# Patient Record
Sex: Female | Born: 1995
Health system: Southern US, Community
[De-identification: ages and names within clinical notes are randomized; demographics above are authoritative.]

---
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
## 2016-01-15 ENCOUNTER — Ambulatory Visit (INDEPENDENT_AMBULATORY_CARE_PROVIDER_SITE_OTHER): Payer: BLUE CROSS/BLUE SHIELD | Admitting: Emergency Medicine

## 2016-01-15 ENCOUNTER — Encounter: Payer: Self-pay | Admitting: Emergency Medicine

## 2016-01-15 ENCOUNTER — Telehealth: Payer: Self-pay

## 2016-01-15 VITALS — BP 120/84 | HR 66 | Temp 98.3°F | Resp 16 | Ht 65.0 in | Wt 159.2 lb

## 2016-01-15 DIAGNOSIS — E049 Nontoxic goiter, unspecified: Secondary | ICD-10-CM | POA: Diagnosis not present

## 2016-01-15 DIAGNOSIS — E010 Iodine-deficiency related diffuse (endemic) goiter: Secondary | ICD-10-CM

## 2016-01-15 DIAGNOSIS — N6001 Solitary cyst of right breast: Secondary | ICD-10-CM | POA: Diagnosis not present

## 2016-01-15 DIAGNOSIS — N6002 Solitary cyst of left breast: Secondary | ICD-10-CM

## 2016-01-15 DIAGNOSIS — R197 Diarrhea, unspecified: Secondary | ICD-10-CM

## 2016-01-15 DIAGNOSIS — M25559 Pain in unspecified hip: Secondary | ICD-10-CM

## 2016-01-15 DIAGNOSIS — R102 Pelvic and perineal pain: Secondary | ICD-10-CM

## 2016-01-15 DIAGNOSIS — R1032 Left lower quadrant pain: Secondary | ICD-10-CM | POA: Diagnosis not present

## 2016-01-15 LAB — POCT CBC
Granulocyte percent: 44.9 %G (ref 37–80)
HCT, POC: 38.7 % (ref 37.7–47.9)
Hemoglobin: 13.4 g/dL (ref 12.2–16.2)
Lymph, poc: 2.4 (ref 0.6–3.4)
MCH: 31 pg (ref 27–31.2)
MCHC: 34.7 g/dL (ref 31.8–35.4)
MCV: 89.5 fL (ref 80–97)
MID (CBC): 0.2 (ref 0–0.9)
MPV: 7.4 fL (ref 0–99.8)
PLATELET COUNT, POC: 239 10*3/uL (ref 142–424)
POC Granulocyte: 2.2 (ref 2–6.9)
POC LYMPH PERCENT: 50.9 %L — AB (ref 10–50)
POC MID %: 4.2 %M (ref 0–12)
RBC: 4.33 M/uL (ref 4.04–5.48)
RDW, POC: 12.9 %
WBC: 4.8 10*3/uL (ref 4.6–10.2)

## 2016-01-15 LAB — POCT URINALYSIS DIP (MANUAL ENTRY)
BILIRUBIN UA: NEGATIVE
BILIRUBIN UA: NEGATIVE
GLUCOSE UA: NEGATIVE
Leukocytes, UA: NEGATIVE
Nitrite, UA: NEGATIVE
PH UA: 6
PROTEIN UA: NEGATIVE
SPEC GRAV UA: 1.015
Urobilinogen, UA: 0.2

## 2016-01-15 LAB — COMPLETE METABOLIC PANEL WITH GFR
ALK PHOS: 64 U/L (ref 47–176)
ALT: 14 U/L (ref 5–32)
AST: 17 U/L (ref 12–32)
Albumin: 4.6 g/dL (ref 3.6–5.1)
BUN: 8 mg/dL (ref 7–20)
CHLORIDE: 106 mmol/L (ref 98–110)
CO2: 23 mmol/L (ref 20–31)
Calcium: 9.3 mg/dL (ref 8.9–10.4)
Creat: 0.88 mg/dL (ref 0.50–1.00)
GFR, Est African American: >89 mL/min (ref >=60)
GLUCOSE: 91 mg/dL (ref 65–99)
POTASSIUM: 4 mmol/L (ref 3.8–5.1)
SODIUM: 138 mmol/L (ref 135–146)
Total Bilirubin: 0.4 mg/dL (ref 0.2–1.1)
Total Protein: 7.3 g/dL (ref 6.3–8.2)

## 2016-01-15 LAB — THYROID PANEL WITH TSH
FREE THYROXINE INDEX: 2.5 (ref 1.4–3.8)
T3 Uptake: 32 % (ref 22–35)
T4, Total: 7.9 ug/dL (ref 4.5–12.0)
TSH: 1.97 m[IU]/L (ref 0.50–4.30)

## 2016-01-15 LAB — POC MICROSCOPIC URINALYSIS (UMFC): Mucus: ABSENT

## 2016-01-15 LAB — POCT URINE PREGNANCY: PREG TEST UR: NEGATIVE

## 2016-01-15 NOTE — Patient Instructions (Addendum)
Take Aleve 1 twice a day. Use a heating pad to your abdomen. I have scheduled you for a lateral breast ultrasound. I have scheduled you for an ultrasound of your pelvis to evaluate her ovaries.    IF you received an x-ray today, you will receive an invoice from [facility]. Please contact [facility] at [phone] with questions or concerns regarding your invoice.   IF you received labwork today, you will receive an invoice from [facility]. Please contact [facility] at [phone] with questions or concerns regarding your invoice.   Our billing staff will not be able to assist you with questions regarding bills from these companies.  You will be contacted with the lab results as soon as they are available. The fastest way to get your results is to activate your My Chart account. Instructions are located on the last page of this paperwork. If you have not heard from us regarding the results in 2 weeks, please contact this office.

## 2016-01-15 NOTE — Progress Notes (Addendum)
Patient ID: Jasmine Hurley, female   DOB: 08-24-1995, 20 y.o.   MRN: [id number]    By signing my name below, I, Essence Howell, attest that this documentation has been prepared under the direction and in the presence of Collene GobbleSteven A Reham Slabaugh, MD Electronically Signed: Charline BillsEssence Howell, ED Scribe 01/15/2016 at 10:29 AM.  Chief Complaint:  Chief Complaint  Patient presents with  . Abdominal Pain    x this week  . Headache  . Diarrhea   HPI: Jasmine Hurley is a 20 y.o. female, with no pertinent medical hx, who [facility] complaining of intermittent, sharp abdominal pain for the past 3 days. Pt states that abdominal pain is exacerbated with palpation. She reports associated symptoms of diarrhea, nausea, 1 episode of emesis last night, HA and a left eye twitch for the past 3 days. Pt reports at least 3 episodes of diarrhea that she describes as watery and mushy daily for the past 3 days. She denies new foods. Pt has also noticed that HAs have increased since she switched birth control from depo to Nexplanon implant. Pt reports irregular periods that last for 3-4 weeks at a time. She has never had a pap smear; states she has never been sexually active. She reports finding cysts in both breasts while in college at [employer]. Pt states that she was supposed to be referred to have the cysts drained but was never referred. No family h/o breast CA. She denies fever, dysuria and vaginal discharge.   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  History reviewed. No pertinent past medical history. History reviewed. No pertinent past surgical history. Social History   Social History  . Marital Status: Single    Spouse Name: N/A  . Number of Children: N/A  . Years of Education: N/A   Social History Main Topics  . Smoking status: Never Smoker   . Smokeless tobacco: None  . Alcohol Use: No  . Drug Use: No  . Sexual Activity: Not Asked   Other Topics Concern  . None   Social History Narrative    Family History  Problem Relation Age of Onset  . Hypertension Mother   . Hypertension Father    No Known Allergies Prior to Admission medications   Medication Sig Start Date End Date Taking? Authorizing Provider  etonogestrel (NEXPLANON) 68 MG IMPL implant 1 each by Subdermal route once.   Yes Historical Provider, MD   ROS: The patient denies fevers, chills, night sweats, unintentional weight loss, chest pain, palpitations, wheezing, dyspnea on exertion, dysuria, hematuria, melena, numbness, weakness, or tingling.   All other systems have been reviewed and were otherwise negative with the exception of those mentioned in the HPI and as above.    PHYSICAL EXAM: Filed Vitals:   01/15/16 0906  BP: 120/84  Pulse: 66  Temp: 98.3 F (36.8 C)  Resp: 16   Body mass index is 26.49 kg/(m^2).  General: Alert, no acute distress HEENT:  Normocephalic, atraumatic, oropharynx patent. Thyroid is diffusely large, L greater than R Eye: EOMI, PEERLDC Cardiovascular: Regular rate and rhythm, no rubs murmurs or gallops. No Carotid bruits, radial pulse intact. No pedal edema.  Respiratory: Clear to auscultation bilaterally. No wheezes, rales, or rhonchi. No cyanosis, no use of accessory musculature Abdominal: Tender in the deep LLQ  Musculoskeletal: Gait intact. No edema, tenderness GU: Minimal fullness beneath the areolar of the L breast but no definite mass Skin: No rashes. Neurologic: Facial musculature symmetric. Psychiatric: Patient  acts appropriately throughout our interaction. Lymphatic: No cervical or submandibular lymphadenopathy  LABS: Results for orders placed or performed in visit on 01/15/16  POCT CBC  Result Value Ref Range   WBC 4.8 4.6 - 10.2 K/uL   Lymph, poc 2.4 0.6 - 3.4   POC LYMPH PERCENT 50.9 (A) 10 - 50 %L   MID (cbc) 0.2 0 - 0.9   POC MID % 4.2 0 - 12 %M   POC Granulocyte 2.2 2 - 6.9   Granulocyte percent 44.9 37 - 80 %G   RBC 4.33 4.04 - 5.48 M/uL   Hemoglobin  13.4 12.2 - 16.2 g/dL   HCT, POC 16.1 09.6 - 47.9 %   MCV 89.5 80 - 97 fL   MCH, POC 31.0 27 - 31.2 pg   MCHC 34.7 31.8 - 35.4 g/dL   RDW, POC 04.5 %   Platelet Count, POC 239 142 - 424 K/uL   MPV 7.4 0 - 99.8 fL  POCT Microscopic Urinalysis ([facility])  Result Value Ref Range   WBC,UR,HPF,POC None None WBC/hpf   RBC,UR,HPF,POC Few (A) None RBC/hpf   Bacteria None None, Too numerous to count   Mucus Absent Absent   Epithelial Cells, UR Per Microscopy Few (A) None, Too numerous to count cells/hpf  POCT urinalysis dipstick  Result Value Ref Range   Color, UA yellow yellow   Clarity, UA clear clear   Glucose, UA negative negative   Bilirubin, UA negative negative   Ketones, POC UA negative negative   Spec Grav, UA 1.015    Blood, UA moderate (A) negative   pH, UA 6.0    Protein Ur, POC negative negative   Urobilinogen, UA 0.2    Nitrite, UA Negative Negative   Leukocytes, UA Negative Negative  POCT urine pregnancy  Result Value Ref Range   Preg Test, Ur Negative Negative   EKG/XRAY:   Primary read interpreted by Dr. Cleta Alberts at [facility].  ASSESSMENT/PLAN:   I suspect she may have had an ovarian cyst rupture. Advised her to try Aleve 1 twice a day for pain. She can also use a heating pad to her abdomen. I will schedule an ultrasound of her breasts as well as an ultrasound of her pelvis for further evaluation. She also have thyromegaly on exam will await the results of her blood tests for this.I personally performed the services described in this documentation, which was scribed in my presence. The recorded information has been reviewed and is accurate.She did have a few red cells on her urinalysis but history did not fit with an kidney stone though she did have some deep left lower abdominal discomfort. I initially was going to do a pelvic exam but patient was very apprehensive and stated repeatedly she had never in her life been sexually active. I chose not to do a pelvic for this  reason.  Gross sideeffects, risk and benefits, and alternatives of medications d/w patient. Patient is aware that all medications have potential sideeffects and we are unable to predict every sideeffect or drug-drug interaction that may occur.  Lesle Chris MD 01/15/2016 9:22 AM

## 2016-01-15 NOTE — Telephone Encounter (Signed)
This has been done - thank you!

## 2016-01-15 NOTE — Telephone Encounter (Signed)
In order the schedule the pelvic ultrasound, an order for a transvaginal ultrasound is also needed.  Please add the order. Thank you.

## 2016-01-16 ENCOUNTER — Other Ambulatory Visit: Payer: Self-pay | Admitting: Emergency Medicine

## 2016-01-16 DIAGNOSIS — E049 Nontoxic goiter, unspecified: Secondary | ICD-10-CM

## 2016-01-26 ENCOUNTER — Other Ambulatory Visit: Payer: Self-pay

## 2016-01-26 DIAGNOSIS — N6002 Solitary cyst of left breast: Secondary | ICD-10-CM

## 2016-01-26 DIAGNOSIS — N6001 Solitary cyst of right breast: Secondary | ICD-10-CM

## 2016-01-26 NOTE — Telephone Encounter (Signed)
[facility] reqs change of orders to Limited Breast US, which is the test that would be needed for a pt of this age. Done per Dr Ellis Parents VO to change.

## 2016-01-29 ENCOUNTER — Ambulatory Visit
Admission: RE | Admit: 2016-01-29 | Discharge: 2016-01-29 | Disposition: A | Discharge status: Home / Self Care | Payer: Medicaid Other | Source: Ambulatory Visit | Attending: Emergency Medicine | Admitting: Emergency Medicine

## 2016-01-29 ENCOUNTER — Ambulatory Visit
Admission: RE | Admit: 2016-01-29 | Discharge: 2016-01-29 | Disposition: A | Discharge status: Home / Self Care | Payer: BLUE CROSS/BLUE SHIELD | Source: Ambulatory Visit | Attending: Emergency Medicine | Admitting: Emergency Medicine

## 2016-01-29 DIAGNOSIS — N6002 Solitary cyst of left breast: Secondary | ICD-10-CM

## 2016-01-29 DIAGNOSIS — R1032 Left lower quadrant pain: Secondary | ICD-10-CM

## 2016-01-29 DIAGNOSIS — R102 Pelvic and perineal pain: Secondary | ICD-10-CM

## 2016-01-29 DIAGNOSIS — E049 Nontoxic goiter, unspecified: Secondary | ICD-10-CM

## 2016-01-29 DIAGNOSIS — N6001 Solitary cyst of right breast: Secondary | ICD-10-CM

## 2016-02-02 ENCOUNTER — Encounter: Payer: Self-pay | Admitting: Radiology

## 2016-02-03 ENCOUNTER — Other Ambulatory Visit: Payer: Self-pay

## 2016-02-03 DIAGNOSIS — N6002 Solitary cyst of left breast: Principal | ICD-10-CM

## 2016-02-03 DIAGNOSIS — N6001 Solitary cyst of right breast: Secondary | ICD-10-CM

## 2016-02-13 ENCOUNTER — Ambulatory Visit: Payer: BLUE CROSS/BLUE SHIELD

## 2016-02-16 ENCOUNTER — Ambulatory Visit: Payer: BLUE CROSS/BLUE SHIELD

## 2016-02-20 ENCOUNTER — Ambulatory Visit (INDEPENDENT_AMBULATORY_CARE_PROVIDER_SITE_OTHER): Payer: BLUE CROSS/BLUE SHIELD | Admitting: Urgent Care

## 2016-02-20 ENCOUNTER — Other Ambulatory Visit: Payer: BLUE CROSS/BLUE SHIELD

## 2016-02-20 ENCOUNTER — Encounter: Payer: Self-pay | Admitting: Family Medicine

## 2016-02-20 VITALS — BP 115/75 | HR 100 | Temp 98.3°F | Resp 16 | Ht 65.5 in | Wt 157.4 lb

## 2016-02-20 DIAGNOSIS — R6889 Other general symptoms and signs: Secondary | ICD-10-CM

## 2016-02-20 DIAGNOSIS — E041 Nontoxic single thyroid nodule: Secondary | ICD-10-CM

## 2016-02-20 DIAGNOSIS — R5383 Other fatigue: Secondary | ICD-10-CM

## 2016-02-20 NOTE — Patient Instructions (Addendum)
Fatigue Fatigue is feeling tired all of the time, a lack of energy, or a lack of motivation. Occasional or mild fatigue is often a normal response to activity or life in general. However, long-lasting (chronic) or extreme fatigue may indicate an underlying medical condition. HOME CARE INSTRUCTIONS  Watch your fatigue for any changes. The following actions may help to lessen any discomfort you are feeling:  Talk to your health care provider about how much sleep you need each night. Try to get the required amount every night.  Take medicines only as directed by your health care provider.  Eat a healthy and nutritious diet. Ask your health care provider if you need help changing your diet.  Drink enough fluid to keep your urine clear or pale yellow.  Practice ways of relaxing, such as yoga, meditation, massage therapy, or acupuncture.  Exercise regularly.   Change situations that cause you stress. Try to keep your work and personal routine reasonable.  Do not abuse illegal drugs.  Limit alcohol intake to no more than 1 drink per day for nonpregnant women and 2 drinks per day for men. One drink equals 12 ounces of beer, 5 ounces of wine, or 1 ounces of hard liquor.  Take a multivitamin, if directed by your health care provider. SEEK MEDICAL CARE IF:   Your fatigue does not get better.  You have a fever.   You have unintentional weight loss or gain.  You have headaches.   You have difficulty:   Falling asleep.  Sleeping throughout the night.  You feel angry, guilty, anxious, or sad.   You are unable to have a bowel movement (constipation).   You skin is dry.   Your legs or another part of your body is swollen.  SEEK IMMEDIATE MEDICAL CARE IF:   You feel confused.   Your vision is blurry.  You feel faint or pass out.   You have a severe headache.   You have severe abdominal, pelvic, or back pain.   You have chest pain, shortness of breath, or an  irregular or fast heartbeat.   You are unable to urinate or you urinate less than normal.   You develop abnormal bleeding, such as bleeding from the rectum, vagina, nose, lungs, or nipples.  You vomit blood.   You have thoughts about harming yourself or committing suicide.   You are worried that you might harm someone else.    This information is not intended to replace advice given to you by your health care provider. Make sure you discuss any questions you have with your health care provider.   Document Released: 04/18/2007 Document Revised: 07/12/2014 Document Reviewed: 10/23/2013 Elsevier Interactive Patient Education 2016 [employer]     IF you received an x-ray today, you will receive an invoice from [facility]. Please contact [facility] at [phone] with questions or concerns regarding your invoice.   IF you received labwork today, you will receive an invoice from [facility]. Please contact [facility] at [phone] with questions or concerns regarding your invoice.   Our billing staff will not be able to assist you with questions regarding bills from these companies.  You will be contacted with the lab results as soon as they are available. The fastest way to get your results is to activate your My Chart account. Instructions are located on the last page of this paperwork. If you have not heard from us regarding the results in 2 weeks, please contact this office.

## 2016-02-20 NOTE — Progress Notes (Signed)
    MRN: [id number] DOB: Jul 21, 1995  Subjective:   Jasmine Hurley is a 20 y.o. female presenting for follow up on abdominal pain and thyroid.   Patient was seen in 01/2016 and had a pelvic US followed by neck US. She reports that all her symptoms have improved including her belly and pelvic discomfort, neck size. She states overall she is doing well except still has fatigue. She would like a review of her labs and plan for follow up. Denies smoking cigarettes or alcohol use.  Jasmine Hurley has a current medication list which includes the following prescription(s): etonogestrel. Also has no allergies on file.  Jasmine Hurley  has no past medical history on file. Also  has no past surgical history on file.   Her family history includes Hypertension in her father and mother.   Objective:   Vitals: BP 115/75 (BP Location: Left Arm, Patient Position: Sitting, Cuff Size: Normal)   Pulse 100   Temp 98.3 F (36.8 C) (Oral)   Resp 16   Ht 5' 5.5" (1.664 m)   Wt 157 lb 6.4 oz (71.4 kg)   SpO2 98%   BMI 25.79 kg/m   Physical Exam  Constitutional: She is oriented to person, place, and time. She appears well-developed and well-nourished.  HENT:  Mouth/Throat: Oropharynx is clear and moist.  Eyes: Pupils are equal, round, and reactive to light. No scleral icterus.  Neck: Normal range of motion. Neck supple. No thyromegaly present.  Cardiovascular: Normal rate, regular rhythm and intact distal pulses.  Exam reveals no gallop and no friction rub.   No murmur heard. Pulmonary/Chest: No respiratory distress. She has no wheezes. She has no rales.  Abdominal: Soft. Bowel sounds are normal. She exhibits no distension and no mass. There is no tenderness.  Neurological: She is alert and oriented to person, place, and time.  Skin: Skin is warm and dry. Capillary refill takes less than 2 seconds.   Assessment and Plan :   1. Other fatigue 2. Cold intolerance 3. Thyroid nodule - Reviewed all results with  patient. Advised better hydration as patient is very active physically and hardly drinks any water. I counseled on recommendations regarding her thyroid as made by the radiologist. Patient verbalized understanding.   Wallis BambergMario Sama Arauz, PA-C [facility] [phone] 02/20/2016 5:52 PM

## 2016-02-21 LAB — CBC
HEMATOCRIT: 37.6 % (ref 35.0–45.0)
HEMOGLOBIN: 12.6 g/dL (ref 11.7–15.5)
MCH: 30.2 pg (ref 27.0–33.0)
MCHC: 33.5 g/dL (ref 32.0–36.0)
MCV: 90.2 fL (ref 80.0–100.0)
MPV: 9.2 fL (ref 7.5–12.5)
PLATELETS: 323 10*3/uL (ref 140–400)
RBC: 4.17 MIL/uL (ref 3.80–5.10)
RDW: 12.3 % (ref 11.0–15.0)
WBC: 4.7 10*3/uL (ref 3.8–10.8)

## 2016-02-23 ENCOUNTER — Encounter: Payer: Self-pay | Admitting: Urgent Care

## 2016-02-23 LAB — HEMOGLOBIN A1C
Hgb A1c MFr Bld: 5.5 % (ref <5.7)
Mean Plasma Glucose: 111 mg/dL

## 2016-02-27 ENCOUNTER — Other Ambulatory Visit: Payer: BLUE CROSS/BLUE SHIELD

## 2016-03-05 ENCOUNTER — Other Ambulatory Visit: Payer: BLUE CROSS/BLUE SHIELD

## 2016-05-28 ENCOUNTER — Ambulatory Visit (INDEPENDENT_AMBULATORY_CARE_PROVIDER_SITE_OTHER): Payer: BLUE CROSS/BLUE SHIELD | Admitting: Family Medicine

## 2016-05-28 ENCOUNTER — Ambulatory Visit: Payer: BLUE CROSS/BLUE SHIELD

## 2016-05-28 VITALS — BP 118/76 | HR 74 | Temp 98.6°F | Resp 16 | Ht 65.0 in | Wt 163.0 lb

## 2016-05-28 DIAGNOSIS — N926 Irregular menstruation, unspecified: Secondary | ICD-10-CM

## 2016-05-28 DIAGNOSIS — R059 Cough, unspecified: Secondary | ICD-10-CM

## 2016-05-28 DIAGNOSIS — R350 Frequency of micturition: Secondary | ICD-10-CM | POA: Diagnosis not present

## 2016-05-28 DIAGNOSIS — R1084 Generalized abdominal pain: Secondary | ICD-10-CM | POA: Diagnosis not present

## 2016-05-28 DIAGNOSIS — R198 Other specified symptoms and signs involving the digestive system and abdomen: Secondary | ICD-10-CM

## 2016-05-28 DIAGNOSIS — R05 Cough: Secondary | ICD-10-CM

## 2016-05-28 LAB — POCT URINALYSIS DIP (MANUAL ENTRY)
Bilirubin, UA: NEGATIVE
GLUCOSE UA: NEGATIVE
Ketones, POC UA: NEGATIVE
Leukocytes, UA: NEGATIVE
NITRITE UA: NEGATIVE
PH UA: 6.5
Protein Ur, POC: NEGATIVE
SPEC GRAV UA: 1.015
UROBILINOGEN UA: 0.2

## 2016-05-28 LAB — POC MICROSCOPIC URINALYSIS (UMFC): Mucus: ABSENT

## 2016-05-28 LAB — POCT URINE PREGNANCY: PREG TEST UR: NEGATIVE

## 2016-05-28 MED ORDER — HYDROCODONE-HOMATROPINE 5-1.5 MG/5ML PO SYRP: ORAL_SOLUTION | ORAL | 0 refills | Status: DC

## 2016-05-28 NOTE — Patient Instructions (Addendum)
For your abdominal symptoms, as we discussed constipations can sometimes present this way. Make sure you're drinking plenty of water throughout the day, fiber in the diet, Colace over-the-counter for stool softener, and if you have not had a bowel movement in 2 days, you can try MiraLAX over-the-counter, one dose. I will also refer you to gastroenterology, but if you have fevers, abdominal pain that lasts for more than 1-2 days, or any worsening of your symptoms, return here or other medical care provider right away.  Your cough, your lungs were clear today, this may be an early upper respiratory infection. See information on cough below. You can try Mucinex or Mucinex DM over-the-counter. If needed for cough at night to allow you to sleep, I did write for some hydrocodone cough syrup. Use the smallest amount necessary and only if needed to help with sleep.  If you have fevers, shortness of breath or other worsening, return for recheck.   Please return to see Benny LennertSarah Weber to discuss the Nexplanon further as well as option/cost of removal and other options.    Cough, Adult Coughing is a reflex that clears your throat and your airways. Coughing helps to heal and protect your lungs. It is normal to cough occasionally, but a cough that happens with other symptoms or lasts a long time may be a sign of a condition that needs treatment. A cough may last only 2-3 weeks (acute), or it may last longer than 8 weeks (chronic). What are the causes? Coughing is commonly caused by:  Breathing in substances that irritate your lungs.  A viral or bacterial respiratory infection.  Allergies.  Asthma.  Postnasal drip.  Smoking.  Acid backing up from the stomach into the esophagus (gastroesophageal reflux).  Certain medicines.  Chronic lung problems, including COPD (or rarely, lung cancer).  Other medical conditions such as heart failure. Follow these instructions at home: Pay attention to any changes in  your symptoms. Take these actions to help with your discomfort:  Take medicines only as told by your health care provider.  If you were prescribed an antibiotic medicine, take it as told by your health care provider. Do not stop taking the antibiotic even if you start to feel better.  Talk with your health care provider before you take a cough suppressant medicine.  Drink enough fluid to keep your urine clear or pale yellow.  If the air is dry, use a cold steam vaporizer or humidifier in your bedroom or your home to help loosen secretions.  Avoid anything that causes you to cough at work or at home.  If your cough is worse at night, try sleeping in a semi-upright position.  Avoid cigarette smoke. If you smoke, quit smoking. If you need help quitting, ask your health care provider.  Avoid caffeine.  Avoid alcohol.  Rest as needed. Contact a health care provider if:  You have new symptoms.  You cough up pus.  Your cough does not get better after 2-3 weeks, or your cough gets worse.  You cannot control your cough with suppressant medicines and you are losing sleep.  You develop pain that is getting worse or pain that is not controlled with pain medicines.  You have a fever.  You have unexplained weight loss.  You have night sweats. Get help right away if:  You cough up blood.  You have difficulty breathing.  Your heartbeat is very fast. This information is not intended to replace advice given to you by your  health care provider. Make sure you discuss any questions you have with your health care provider. Document Released: 12/18/2010 Document Revised: 11/27/2015 Document Reviewed: 08/28/2014 Elsevier Interactive Patient Education  2017 Elsevier Inc.  Diarrhea, Adult Diarrhea is frequent loose and watery bowel movements. Diarrhea can make you feel weak and cause you to become dehydrated. Dehydration can make you tired and thirsty, cause you to have a dry mouth, and  decrease how often you urinate. Diarrhea typically lasts 2-3 days. However, it can last longer if it is a sign of something more serious. It is important to treat your diarrhea as told by your health care provider. Follow these instructions at home: Eating and drinking Follow these recommendations as told by your health care provider:  Take an oral rehydration solution (ORS). This is a drink that is sold at pharmacies and retail stores.  Drink clear fluids, such as water, ice chips, diluted fruit juice, and low-calorie sports drinks.  Eat bland, easy-to-digest foods in small amounts as you are able. These foods include bananas, applesauce, rice, lean meats, toast, and crackers.  Avoid drinking fluids that contain a lot of sugar or caffeine, such as energy drinks, sports drinks, and soda.  Avoid alcohol.  Avoid spicy or fatty foods. General instructions  Drink enough fluid to keep your urine clear or pale yellow.  Wash your hands often. If soap and water are not available, use hand sanitizer.  Make sure that all people in your household wash their hands well and often.  Take over-the-counter and prescription medicines only as told by your health care provider.  Rest at home while you recover.  Watch your condition for any changes.  Take a warm bath to relieve any burning or pain from frequent diarrhea episodes.  Keep all follow-up visits as told by your health care provider. This is important. Contact a health care provider if:  You have a fever.  Your diarrhea gets worse.  You have new symptoms.  You cannot keep fluids down.  You feel light-headed or dizzy.  You have a headache  You have muscle cramps. Get help right away if:  You have chest pain.  You feel extremely weak or you faint.  You have bloody or black stools or stools that look like tar.  You have severe pain, cramping, or bloating in your abdomen.  You have trouble breathing or you are breathing  very quickly.  Your heart is beating very quickly.  Your skin feels cold and clammy.  You feel confused.  You have signs of dehydration, such as:  Dark urine, very little urine, or no urine.  Cracked lips.  Dry mouth.  Sunken eyes.  Sleepiness.  Weakness. This information is not intended to replace advice given to you by your health care provider. Make sure you discuss any questions you have with your health care provider. Document Released: 06/11/2002 Document Revised: 10/30/2015 Document Reviewed: 02/25/2015 Elsevier Interactive Patient Education  2017 [employer]. About Constipation  Constipation Overview Constipation is the most common gastrointestinal complaint - about 4 million Americans experience constipation and make 2.5 million physician visits a year to get help for the problem.  Constipation can occur when the colon absorbs too much water, the colon's muscle contraction is slow or sluggish, and/or there is delayed transit time through the colon.  The result is stool that is hard and dry.  Indicators of constipation include straining during bowel movements greater than 25% of the time, having fewer than three bowel movements  per week, and/or the feeling of incomplete evacuation.  There are established guidelines ([place] II ) for defining constipation. A person needs to have two or more of the following symptoms for at least 12 weeks (not necessarily consecutive) in the preceding 12 months: . Straining in  greater than 25% of bowel movements . Lumpy or hard stools in greater than 25% of bowel movements . Sensation of incomplete emptying in greater than 25% of bowel movements . Sensation of anorectal obstruction/blockade in greater than 25% of bowel movements . Manual maneuvers to help empty greater than 25% of bowel movements (e.g., digital evacuation, support of the pelvic floor)  . Less than  3 bowel movements/week . Loose stools are not present, and criteria for  irritable bowel syndrome are insufficient  Common Causes of Constipation . Lack of fiber in your diet . Lack of physical activity . Medications, including iron and calcium supplements  . Dairy intake . Dehydration . Abuse of laxatives  Travel  Irritable Bowel Syndrome  Pregnancy  Luteal phase of menstruation (after ovulation and before menses)  Colorectal problems  Intestinal Dysfunction  Treating Constipation  There are several ways of treating constipation, including changes to diet and exercise, use of laxatives, adjustments to the pelvic floor, and scheduled toileting.  These treatments include: . increasing fiber and fluids in the diet  . increasing physical activity . learning muscle coordination   learning proper toileting techniques and toileting modifications   designing and sticking  to a toileting schedule     2007, [employer] Doc.22  IF you received an x-ray today, you will receive an invoice from [facility]. Please contact [facility] at [phone] with questions or concerns regarding your invoice.   IF you received labwork today, you will receive an invoice from [facility]. Please contact [facility] at [phone] with questions or concerns regarding your invoice.   Our billing staff will not be able to assist you with questions regarding bills from these companies.  You will be contacted with the lab results as soon as they are available. The fastest way to get your results is to activate your My Chart account. Instructions are located on the last page of this paperwork. If you have not heard from us regarding the results in 2 weeks, please contact this office.

## 2016-05-28 NOTE — Progress Notes (Signed)
Subjective:    Patient ID: Jasmine Hurley, female    DOB: [birth year], 20 y.o.   MRN: [id number]  HPI Jasmine Hurley is a 20 y.o. female  Over past week - has trouble initiating bowel movement,but small pellets. Then has some diarrhea at other times - liquid stool at other times. No attempted treatments. No similar sx's previously. abd pain at times - sharp pains lower abdomen, sometimes on sides. More with soda. abd pain at times off and on since the summer. Occasional bladder full feeling, urinary frequency - but not new.   LMP - unknown. Irregular menses. Last bleeding few days ago. Spotting.  Received nexplanon December 2017, irregular bleeding/spotting since March. Does not like having Nexplanon. Would like to have removed. Would like to go onto Depo-Provera shot instead.  Cough few times today, no fever or dyspnea. Minimal cough.    Patient Active Problem List   Diagnosis Date Noted  . Thyromegaly 01/15/2016   No past medical history on file. No past surgical history on file. No Known Allergies Prior to Admission medications   Medication Sig Start Date End Date Taking? Authorizing Provider  etonogestrel (NEXPLANON) 68 MG IMPL implant 1 each by Subdermal route once.   Yes Historical Provider, MD   Social History   Social History  . Marital status: Single    Spouse name: N/A  . Number of children: N/A  . Years of education: N/A   Occupational History  . Not on file.   Social History Main Topics  . Smoking status: Never Smoker  . Smokeless tobacco: Not on file  . Alcohol use No  . Drug use: No  . Sexual activity: Not on file   Other Topics Concern  . Not on file   Social History Narrative  . No narrative on file      Review of Systems     Objective:   Physical Exam  Constitutional: She appears well-developed and well-nourished. No distress.  Pulmonary/Chest: Effort normal.  Abdominal: Soft. Normal appearance and bowel sounds are normal. She exhibits no  distension. There is no hepatosplenomegaly. There is no tenderness. There is no rebound, no guarding and no CVA tenderness.  Skin: Skin is warm and dry.  Vitals reviewed.  Vitals:   05/28/16 1130  BP: 118/76  Pulse: 74  Resp: 16  Temp: 98.6 F (37 C)  TempSrc: Oral  Weight: 163 lb (73.9 kg)  Height: 5\' 5"  (1.651 m)   Results for orders placed or performed in visit on 05/28/16  POCT urine pregnancy  Result Value Ref Range   Preg Test, Ur Negative Negative  POCT urinalysis dipstick  Result Value Ref Range   Color, UA yellow yellow   Clarity, UA clear clear   Glucose, UA negative negative   Bilirubin, UA negative negative   Ketones, POC UA negative negative   Spec Grav, UA 1.015    Blood, UA small (A) negative   pH, UA 6.5    Protein Ur, POC negative negative   Urobilinogen, UA 0.2    Nitrite, UA Negative Negative   Leukocytes, UA Negative Negative  POCT Microscopic Urinalysis ([facility])  Result Value Ref Range   WBC,UR,HPF,POC None None WBC/hpf   RBC,UR,HPF,POC None None RBC/hpf   Bacteria None None, Too numerous to count   Mucus Absent Absent   Epithelial Cells, UR Per Microscopy None None, Too numerous to count cells/hpf      Assessment & Plan:  Jasmine Hurley is a  20 y.o. female Abdominal pain, generalized - Plan: POCT urine pregnancy, POCT urinalysis dipstick, POCT Microscopic Urinalysis ([facility]), CANCELED: DG Abd 1 View Urinary frequency - Plan: POCT urine pregnancy, POCT urinalysis dipstick, POCT Microscopic Urinalysis ([facility]) Alternating constipation and diarrhea - Plan: CANCELED: DG Abd 1 View Discussed 1 view abdominal x-ray to rule out constipation or significant stool burden, but this was refused. Urinalysis is reassuring.  Afebrile and nontender on exam, so CBC was not ordered. Possible alternating diarrhea and constipation may be IBS versus constipation with leaking of stool around impaction.  -Will refer to gastroenterology for further evaluation, but in the  meantime, fluids, fiber in the diet, and Colace as needed for stool softener. If unable to have a bowel movement by day 2, MiraLAX over-the-counter if needed.  -If fever, increasing abdominal pain, or persistent abdominal pain  more than 2 days, return for recheck here or other medical care provider.  - note given for out of school last Thursday d/t abd pain, but advised in future will need to be seen at time of acute illness for note.   Irregular menstrual cycle  - Likely due to Nexplanon, would like different form of injectable contraception and would like to have Nexplanon removed. We'll arrange for her to have a visit with Benny LennertSarah Weber to discuss this further  Cough  - Likely early viral infection. Reassuring exam.Minimal cough today, lungs are clear, possible early upper respiratory infection.  Symptomatic care discussed, coupon Mucinex DM, RTC precautions given. At completion of visit, also requested cough syrup for nighttime cough after she declined earlier in visit. Agree to write for hydrocodone cough syrup at bedtime only if needed, side effects were discussed.   No orders of the defined types were placed in this encounter.  Patient Instructions       IF you received an x-ray today, you will receive an invoice from [facility]. Please contact [facility] at [phone] with questions or concerns regarding your invoice.   IF you received labwork today, you will receive an invoice from [facility]. Please contact [facility] at [phone] with questions or concerns regarding your invoice.   Our billing staff will not be able to assist you with questions regarding bills from these companies.  You will be contacted with the lab results as soon as they are available. The fastest way to get your results is to activate your My Chart account. Instructions are located on the last page of this paperwork. If you have not heard from us regarding the  results in 2 weeks, please contact this office.       I personally performed the services described in this documentation, which was scribed in my presence. The recorded information has been reviewed and considered, and addended by me as needed.   Signed,   Meredith StaggersJeffrey Binyamin Nelis, MD [facility].  05/28/16 1:28 PM

## 2016-06-03 ENCOUNTER — Other Ambulatory Visit: Payer: BLUE CROSS/BLUE SHIELD

## 2016-06-10 ENCOUNTER — Ambulatory Visit
Admission: RE | Admit: 2016-06-10 | Discharge: 2016-06-10 | Disposition: A | Discharge status: Home / Self Care | Payer: BLUE CROSS/BLUE SHIELD | Source: Ambulatory Visit | Attending: Emergency Medicine | Admitting: Emergency Medicine

## 2016-08-27 ENCOUNTER — Ambulatory Visit: Payer: BLUE CROSS/BLUE SHIELD

## 2016-09-03 ENCOUNTER — Ambulatory Visit: Payer: BLUE CROSS/BLUE SHIELD

## 2016-09-04 ENCOUNTER — Ambulatory Visit (INDEPENDENT_AMBULATORY_CARE_PROVIDER_SITE_OTHER): Payer: 59 | Admitting: Physician Assistant

## 2016-09-04 VITALS — BP 120/78 | HR 88 | Temp 98.3°F | Resp 16 | Ht 65.0 in | Wt 165.0 lb

## 2016-09-04 DIAGNOSIS — Z111 Encounter for screening for respiratory tuberculosis: Secondary | ICD-10-CM | POA: Diagnosis not present

## 2016-09-04 NOTE — Care Management (Signed)

## 2016-09-04 NOTE — Progress Notes (Signed)
  09/04/2016 11:39 AM   DOB: 1996-01-30 / MRN: [id number]  SUBJECTIVE:  Jasmine Hurley is a 21 y.o. female presenting for CNA clinicals. Never tested positive. Denies unexplained weight loss, chronic cough, night sweats.   She has No Known Allergies.   She  has no past medical history on file.    She  reports that she has never smoked. She has never used smokeless tobacco. She reports that she does not drink alcohol or use drugs. She  has no sexual activity history on file. The patient  has no past surgical history on file.  Her family history includes Hypertension in her father and mother.  ROS  Per HPI  The problem list and medications were reviewed and updated by myself where necessary and exist elsewhere in the encounter.   OBJECTIVE:  BP 120/78   Pulse 88   Temp 98.3 F (36.8 C) (Oral)   Resp 16   Ht 5\' 5"  (1.651 m)   Wt 165 lb (74.8 kg)   SpO2 97%   BMI 27.46 kg/m   Physical Exam  Constitutional: She appears well-developed and well-nourished. No distress.  Cardiovascular: Normal rate and regular rhythm.   Pulmonary/Chest: Effort normal and breath sounds normal. No respiratory distress. She has no wheezes. She has no rales. She exhibits no tenderness.  Skin: She is not diaphoretic.    No results found for this or any previous visit (from the past 72 hour(s)).  No results found.  ASSESSMENT AND PLAN:  Archie Pattenonya was seen today for ppd placement.  Diagnoses and all orders for this visit:  Screening for tuberculosis -     Quantiferon tb gold assay -     Care order/instruction:    The patient is advised to call or return to clinic if she does not see an improvement in symptoms, or to seek the care of the closest emergency department if she worsens with the above plan.   Deliah BostonMichael Clark, MHS, PA-C [facility] 09/04/2016 11:39 AM

## 2016-09-04 NOTE — Patient Instructions (Signed)
     IF you received an x-ray today, you will receive an invoice from [facility]. Please contact [facility] at [phone] with questions or concerns regarding your invoice.   IF you received labwork today, you will receive an invoice from [facility]. Please contact [facility] at [phone] with questions or concerns regarding your invoice.   Our billing staff will not be able to assist you with questions regarding bills from these companies.  You will be contacted with the lab results as soon as they are available. The fastest way to get your results is to activate your My Chart account. Instructions are located on the last page of this paperwork. If you have not heard from us regarding the results in 2 weeks, please contact this office.     

## 2016-09-07 LAB — QUANTIFERON IN TUBE
QFT TB AG MINUS NIL VALUE: <0 IU/mL
QUANTIFERON TB AG VALUE: 0.02 IU/mL
QUANTIFERON TB GOLD: NEGATIVE
Quantiferon Nil Value: 0.03 IU/mL

## 2016-09-07 LAB — QUANTIFERON TB GOLD ASSAY (BLOOD)

## 2016-09-10 ENCOUNTER — Encounter: Payer: Self-pay | Admitting: *Deleted

## 2016-09-20 ENCOUNTER — Ambulatory Visit (INDEPENDENT_AMBULATORY_CARE_PROVIDER_SITE_OTHER): Payer: 59 | Admitting: Family Medicine

## 2016-09-20 VITALS — BP 120/62 | HR 76 | Temp 97.9°F | Resp 18 | Ht 65.0 in | Wt 167.0 lb

## 2016-09-20 DIAGNOSIS — R198 Other specified symptoms and signs involving the digestive system and abdomen: Secondary | ICD-10-CM | POA: Diagnosis not present

## 2016-09-20 DIAGNOSIS — R81 Glycosuria: Secondary | ICD-10-CM | POA: Diagnosis not present

## 2016-09-20 DIAGNOSIS — R112 Nausea with vomiting, unspecified: Secondary | ICD-10-CM | POA: Diagnosis not present

## 2016-09-20 DIAGNOSIS — R109 Unspecified abdominal pain: Secondary | ICD-10-CM

## 2016-09-20 DIAGNOSIS — R739 Hyperglycemia, unspecified: Secondary | ICD-10-CM | POA: Diagnosis not present

## 2016-09-20 LAB — POCT URINALYSIS DIP (MANUAL ENTRY)
Bilirubin, UA: NEGATIVE
Glucose, UA: 500 — AB
Ketones, POC UA: NEGATIVE
LEUKOCYTES UA: NEGATIVE
NITRITE UA: NEGATIVE
PH UA: 6 (ref 5.0–8.0)
PROTEIN UA: NEGATIVE
Spec Grav, UA: 1.025 (ref 1.030–1.035)
Urobilinogen, UA: 0.2 (ref ?–2.0)

## 2016-09-20 LAB — GLUCOSE, POCT (MANUAL RESULT ENTRY): POC GLUCOSE: 158 mg/dL — AB (ref 70–99)

## 2016-09-20 LAB — POCT CBC
Granulocyte percent: 57 %G (ref 37–80)
HCT, POC: 35 % — AB (ref 37.7–47.9)
HEMOGLOBIN: 12.1 g/dL — AB (ref 12.2–16.2)
Lymph, poc: 2.4 (ref 0.6–3.4)
MCH: 31.2 pg (ref 27–31.2)
MCHC: 34.5 g/dL (ref 31.8–35.4)
MCV: 90.3 fL (ref 80–97)
MID (cbc): 0.4 (ref 0–0.9)
MPV: 7.1 fL (ref 0–99.8)
PLATELET COUNT, POC: 284 10*3/uL (ref 142–424)
POC Granulocyte: 3.7 (ref 2–6.9)
POC LYMPH PERCENT: 36.6 %L (ref 10–50)
POC MID %: 6.4 % (ref 0–12)
RBC: 3.88 M/uL — AB (ref 4.04–5.48)
RDW, POC: 13.5 %
WBC: 6.5 10*3/uL (ref 4.6–10.2)

## 2016-09-20 LAB — POC MICROSCOPIC URINALYSIS (UMFC): Mucus: ABSENT

## 2016-09-20 LAB — POCT URINE PREGNANCY: Preg Test, Ur: NEGATIVE

## 2016-09-20 NOTE — Progress Notes (Addendum)
By signing my name below, I, Mesha Guinyard, attest that this documentation has been prepared under the direction and in the presence of Meredith Staggers, MD.  Electronically Signed: Arvilla Market, Medical Scribe. 09/20/16. 6:17 PM.  Subjective:    Patient ID: Jasmine Hurley, female    DOB: 01/20/1996, 20 y.o.   MRN: [id number]  HPI Chief Complaint  Patient presents with  . Abdominal Pain    Gagging feeling.    HPI Comments: Jasmine Hurley is a 21 y.o. female who presents to the Primary Care at [facility] and [facility] complaining of mid abdominal pain onset a month ago.  She was seen for abdominal pain by my self nov 2017. She was having alternating constipation and diarrhea, she had a reassuring labs since declined abdominal x-ray and she was referred to gastroenterology. Previous multiple phone calls were placed. Thyroid ultrasound July 2017 with tiny bilateral solid and cystic nodules less than 3 mm. Recommended recheck with ultrasound in 3 months.  Reports associated sxs of "waves of" nausea, emesis episode 2 days ago, coughing that triggers gagging, hot and cold intolerance since Feb, and chills. Pt has a bowel movement every 2-3 days but suspects it's due to her diet. She reports straining during her bowel movement yesterday. LMP was Feb and she has a regular cycle with her nexplanon insertion. Pt has not seen GI since being referred since most of her her sxs have resolved. Denies fever, hematuria, difficulty urinating, and diarrhea.  Patient Active Problem List   Diagnosis Date Noted  . Thyromegaly 01/15/2016   History reviewed. No pertinent past medical history. History reviewed. No pertinent surgical history. No Known Allergies Prior to Admission medications   Medication Sig Start Date End Date Taking? Authorizing Provider  etonogestrel (NEXPLANON) 68 MG IMPL implant 1 each by Subdermal route once.   Yes Historical Provider, MD   Social History   Social History  .  Marital status: Single    Spouse name: N/A  . Number of children: N/A  . Years of education: N/A   Occupational History  . Not on file.   Social History Main Topics  . Smoking status: Never Smoker  . Smokeless tobacco: Never Used  . Alcohol use No  . Drug use: No  . Sexual activity: Not on file   Other Topics Concern  . Not on file   Social History Narrative  . No narrative on file   Review of Systems  Constitutional: Positive for chills. Negative for fever.  Gastrointestinal: Positive for abdominal pain, constipation, nausea and vomiting. Negative for diarrhea.  Endocrine: Positive for cold intolerance and heat intolerance.  Genitourinary: Negative for difficulty urinating and hematuria.   Objective:  Physical Exam  Constitutional: She appears well-developed and well-nourished. No distress.  HENT:  Head: Normocephalic and atraumatic.  Eyes: Conjunctivae are normal.  Neck: Neck supple.  Cardiovascular: Normal rate, regular rhythm and normal heart sounds.  Exam reveals no gallop and no friction rub.   No murmur heard. Pulmonary/Chest: Effort normal and breath sounds normal. No respiratory distress. She has no wheezes. She has no rales.  Abdominal: There is tenderness (slight) in the right upper quadrant, suprapubic area and left lower quadrant. There is tenderness at McBurney's point (some). There is no CVA tenderness and negative Murphy's sign.  Neurological: She is alert.  Skin: Skin is warm and dry.  Psychiatric: She has a normal mood and affect. Her behavior is normal.  Nursing note and vitals reviewed.  Vitals:   09/20/16 1803  BP: 120/62  Pulse: 76  Resp: 18  Temp: 97.9 F (36.6 C)  TempSrc: Oral  SpO2: 99%  Weight: 167 lb (75.8 kg)  Height: 5\' 5"  (1.651 m)  Body mass index is 27.79 kg/m.   Results for orders placed or performed in visit on 09/20/16  POCT urinalysis dipstick  Result Value Ref Range   Color, UA yellow yellow   Clarity, UA clear clear     Glucose, UA =500 (A) negative   Bilirubin, UA negative negative   Ketones, POC UA negative negative   Spec Grav, UA 1.025 1.030 - 1.035   Blood, UA trace-intact (A) negative   pH, UA 6.0 5.0 - 8.0   Protein Ur, POC negative negative   Urobilinogen, UA 0.2 Negative - 2.0   Nitrite, UA Negative Negative   Leukocytes, UA Negative Negative  POCT Microscopic Urinalysis ([facility])  Result Value Ref Range   WBC,UR,HPF,POC None None WBC/hpf   RBC,UR,HPF,POC None None RBC/hpf   Bacteria None None, Too numerous to count   Mucus Absent Absent   Epithelial Cells, UR Per Microscopy Moderate (A) None, Too numerous to count cells/hpf  POCT urine pregnancy  Result Value Ref Range   Preg Test, Ur Negative Negative  POCT CBC  Result Value Ref Range   WBC 6.5 4.6 - 10.2 K/uL   Lymph, poc 2.4 0.6 - 3.4   POC LYMPH PERCENT 36.6 10 - 50 %L   MID (cbc) 0.4 0 - 0.9   POC MID % 6.4 0 - 12 %M   POC Granulocyte 3.7 2 - 6.9   Granulocyte percent 57.0 37 - 80 %G   RBC 3.88 (A) 4.04 - 5.48 M/uL   Hemoglobin 12.1 (A) 12.2 - 16.2 g/dL   HCT, POC 16.1 (A) 09.6 - 47.9 %   MCV 90.3 80 - 97 fL   MCH, POC 31.2 27 - 31.2 pg   MCHC 34.5 31.8 - 35.4 g/dL   RDW, POC 04.5 %   Platelet Count, POC 284 142 - 424 K/uL   MPV 7.1 0 - 99.8 fL  POCT glucose (manual entry)  Result Value Ref Range   POC Glucose 158 (A) 70 - 99 mg/dl     Assessment & Plan:  Jasmine Hurley is a 21 y.o. female Recurrent abdominal pain, Nausea and vomiting, intractability of vomiting not specified, unspecified vomiting type - Plan: POCT urinalysis dipstick, POCT Microscopic Urinalysis ([facility]), POCT urine pregnancy, POCT CBC, Comprehensive metabolic panel, Lipase Irregular bowel habits  - Generalized abdominal discomfort, few episodes of nausea, vomiting. Reassuring exam at present, reassuring vital signs and blood work as above.   -Check lipase, CMP. RTC precautions if abdominal pain persists this week or any worsening including fevers,  recurrent nausea/vomiting.   -If recurrent episodes of abdominal pain, would recommend being seen by gastroenterology again.  History of thyroid abnormality. Recommended repeat scan in one year. Advised patient to set alarm on phone or schedule appointment to discuss testing in July  Glycosuria - Plan: POCT glucose (manual entry), Hemoglobin A1c Hyperglycemia - Plan: Hemoglobin A1c  - Possible diabetes/prediabetes. Check A1c, then possible office visit to discuss further.  No orders of the defined types were placed in this encounter.  Patient Instructions       IF you received an x-ray today, you will receive an invoice from [facility]. Please contact [facility] at [phone] with questions or concerns regarding your invoice.   IF you received  labwork today, you will receive an invoice from [facility]. Please contact [facility] at [phone] with questions or concerns regarding your invoice.   Our billing staff will not be able to assist you with questions regarding bills from these companies.  You will be contacted with the lab results as soon as they are available. The fastest way to get your results is to activate your My Chart account. Instructions are located on the last page of this paperwork. If you have not heard from us regarding the results in 2 weeks, please contact this office.       I personally performed the services described in this documentation, which was scribed in my presence. The recorded information has been reviewed and considered for accuracy and completeness, addended by me as needed, and agree with information above.  Signed,   Meredith StaggersJeffrey Meelah Tallo, MD Primary [facility] at [facility].  09/20/16 6:57 PM

## 2016-09-20 NOTE — Patient Instructions (Addendum)
Blood sugar was slightly elevated in the office. I will check a 3 month blood sugar average to look for prediabetes or diabetes. Once I have that result, we'll let you know.  Increase fluids, fiber in the diet or stool softener to have more regular bowel movements.   Blood counts, urine tests overall reassuring. I will check a pancreas and liver tests again, but suspect these  will be normal. If your abdominal pain is not improving this week, or any worsening including fevers, return of vomiting, or other worsening symptoms, recommend recheck here or the emergency room.   If you continue to have abdominal pain, would recommend follow-up with gastroenterologist as we discussed last year.  Based on your ultrasound of the thyroid last July, would return for repeat testing this July.  IF you received an x-ray today, you will receive an invoice from [facility]. Please contact [facility] at [phone] with questions or concerns regarding your invoice.   IF you received labwork today, you will receive an invoice from [facility]. Please contact [facility] at [phone] with questions or concerns regarding your invoice.   Our billing staff will not be able to assist you with questions regarding bills from these companies.  You will be contacted with the lab results as soon as they are available. The fastest way to get your results is to activate your My Chart account. Instructions are located on the last page of this paperwork. If you have not heard from us regarding the results in 2 weeks, please contact this office.     Abdominal Pain, Adult Abdominal pain can be caused by many things. Often, abdominal pain is not serious and it gets better with no treatment or by being treated at home. However, sometimes abdominal pain is serious. Your health care provider will do a medical history and a physical exam to try to determine the cause of your abdominal pain. Follow these instructions  at home:  Take over-the-counter and prescription medicines only as told by your health care provider. Do not take a laxative unless told by your health care provider.  Drink enough fluid to keep your urine clear or pale yellow.  Watch your condition for any changes.  Keep all follow-up visits as told by your health care provider. This is important. Contact a health care provider if:  Your abdominal pain changes or gets worse.  You are not hungry or you lose weight without trying.  You are constipated or have diarrhea for more than 2-3 days.  You have pain when you urinate or have a bowel movement.  Your abdominal pain wakes you up at night.  Your pain gets worse with meals, after eating, or with certain foods.  You are throwing up and cannot keep anything down.  You have a fever. Get help right away if:  Your pain does not go away as soon as your health care provider told you to expect.  You cannot stop throwing up.  Your pain is only in areas of the abdomen, such as the right side or the left lower portion of the abdomen.  You have bloody or black stools, or stools that look like tar.  You have severe pain, cramping, or bloating in your abdomen.  You have signs of dehydration, such as:  Dark urine, very little urine, or no urine.  Cracked lips.  Dry mouth.  Sunken eyes.  Sleepiness.  Weakness. This information is not intended to replace advice given to you by your health  care provider. Make sure you discuss any questions you have with your health care provider. Document Released: 03/31/2005 Document Revised: 01/09/2016 Document Reviewed: 12/03/2015 Elsevier Interactive Patient Education  2017 [employer].

## 2016-09-21 LAB — COMPREHENSIVE METABOLIC PANEL
ALBUMIN: 4.2 g/dL (ref 3.5–5.5)
ALK PHOS: 62 IU/L (ref 39–117)
ALT: 16 IU/L (ref 0–32)
AST: 17 IU/L (ref 0–40)
Albumin/Globulin Ratio: 1.5 (ref 1.2–2.2)
BUN/Creatinine Ratio: 10 (ref 9–23)
BUN: 7 mg/dL (ref 6–20)
CHLORIDE: 101 mmol/L (ref 96–106)
CO2: 21 mmol/L (ref 18–29)
CREATININE: 0.7 mg/dL (ref 0.57–1.00)
Calcium: 8.9 mg/dL (ref 8.7–10.2)
GFR calc Af Amer: 144 mL/min/{1.73_m2} (ref >59)
GFR calc non Af Amer: 125 mL/min/{1.73_m2} (ref >59)
GLUCOSE: 142 mg/dL — AB (ref 65–99)
Globulin, Total: 2.8 g/dL (ref 1.5–4.5)
Potassium: 4.3 mmol/L (ref 3.5–5.2)
Sodium: 137 mmol/L (ref 134–144)
Total Protein: 7 g/dL (ref 6.0–8.5)

## 2016-09-21 LAB — LIPASE: LIPASE: 56 U/L (ref 14–72)

## 2016-09-21 LAB — HEMOGLOBIN A1C
ESTIMATED AVERAGE GLUCOSE: 114 mg/dL
Hgb A1c MFr Bld: 5.6 % (ref 4.8–5.6)

## 2017-02-22 ENCOUNTER — Ambulatory Visit: Payer: 59 | Admitting: Family Medicine

## 2017-03-11 ENCOUNTER — Ambulatory Visit: Payer: 59 | Admitting: Physician Assistant

## 2017-03-12 ENCOUNTER — Encounter: Payer: Self-pay | Admitting: Family Medicine

## 2017-03-12 ENCOUNTER — Ambulatory Visit (INDEPENDENT_AMBULATORY_CARE_PROVIDER_SITE_OTHER): Payer: 59 | Admitting: Family Medicine

## 2017-03-12 VITALS — BP 122/82 | HR 77 | Temp 98.0°F | Resp 16 | Ht 68.11 in | Wt 169.0 lb

## 2017-03-12 DIAGNOSIS — N926 Irregular menstruation, unspecified: Secondary | ICD-10-CM | POA: Diagnosis not present

## 2017-03-12 DIAGNOSIS — N898 Other specified noninflammatory disorders of vagina: Secondary | ICD-10-CM | POA: Diagnosis not present

## 2017-03-12 DIAGNOSIS — Z7251 High risk heterosexual behavior: Secondary | ICD-10-CM | POA: Diagnosis not present

## 2017-03-12 LAB — POCT URINE PREGNANCY: Preg Test, Ur: NEGATIVE

## 2017-03-12 LAB — POCT WET + KOH PREP
Trich by wet prep: ABSENT
Yeast by KOH: ABSENT
Yeast by wet prep: ABSENT

## 2017-03-12 MED ORDER — AZITHROMYCIN 250 MG PO TABS: 1000.0000 mg | ORAL_TABLET | Freq: Once | ORAL | 0 refills | Status: AC

## 2017-03-12 MED ORDER — CEFTRIAXONE SODIUM 250 MG IJ SOLR
250.0000 mg | Freq: Once | INTRAMUSCULAR | Status: AC
  Administered 2017-03-12: 250 mg via INTRAMUSCULAR

## 2017-03-12 NOTE — Patient Instructions (Addendum)
Am concerned about a possible sexually transmitted infection. For that reason you were given a shot of antibiotic in the office to cover for gonorrhea, and take the for azithromycin pills to treat possible chlamydia. I will check those tests as well as HIV and syphilis tests, and you should receive those results sometime next week.  Return to the clinic or go to the nearest emergency room if any of your symptoms worsen or new symptoms occur.   Cervicitis Cervicitis is irritation and swelling of the cervix. The cervix is the lower and narrow end of the uterus. It is the part of the uterus that opens up to the vagina. What are the causes? This condition may be caused by:  An STI (sexually transmitted infection), such as gonorrhea, chlamydia, or genital herpes.  Objects that are put in the vagina, such as tampons or birth control devices. This usually occurs if an object is left in for too long.  Chemical irritation or allergic reaction. This may be from vaginal douches, latex condoms, or contraceptive creams.  An injury to the cervix.  A bacterial infection.  Radiation therapy.  What increases the risk? You are more likely to develop this condition if:  You have unprotected sex.  You have sex with many partners.  You have a new sexual partner.  You start having sex at an early age.  You have a history of STIs.  What are the signs or symptoms? Symptoms of this condition include:  Wallace Cullens, white, yellow, or bad-smelling vaginal discharge.  Pain or itchiness around the vagina.  Pain during sex.  Pain in the lower abdomen or lower back, especially during sex.  Urinating often.  Pain during urination.  Abnormal vaginal bleeding, such as bleeding between periods, after sex, or after menopause.  In some cases, there are no symptoms. How is this diagnosed? This condition may be diagnosed with:  A pelvic exam. Your health care provider will examine whether the cervix has  an unusual discharge or bleeds easily when touched with a swab.  A wet prep. This is a test in which vaginal discharge is examined under a microscope to check for signs of infection.  A swab test of the cervix. For this test, sample cells from the cervix are collected on a swab and examined under a microscope to check for signs of infection.  Urine tests.  How is this treated? Treatment for cervicitis depends on what is causing the condition. Treatment may include:  Antibiotic medicines. These are used to treat certain infections, including STIs like gonorrhea or chlamydia. If you are taking these medicines to treat an STI, your sexual partner may also need to take these medicines.  Antiviral medicines. These are used to treat herpes simplex virus. Your sexual partner may also need to take these medicines.  Stopping use of items that cause irritation, such as tampons, latex condoms, douches, or spermicides.  Follow these instructions at home:  Do not have sex until your health care provider says it is okay.  Take over-the-counter and prescription medicines only as told by your health care provider.  If you were prescribed an antibiotic, take it as told by your health care provider. Do not stop taking the antibiotic even if you start to feel better.  Keep all follow-up visits as told by your health care provider. This is important. Contact a health care provider if:  Your symptoms come back or get worse after treatment.  You have a fever.  You have  fatigue.  You have pain in your abdomen.  You experience nausea, vomiting, or diarrhea.  You have back pain. Get help right away if:  You have severe abdominal pain that cannot be helped with medicine.  You cannot urinate. Summary  Cervicitis is irritation and swelling of the cervix.  This condition may be caused by an STI (sexually transmitted infection), an allergic reaction or chemical irritation, radiation therapy, or  objects that are put in the vagina, such as tampons or diaphragms.  Symptoms of this condition can include unusual vaginal discharge, painful urination, irritation or pain around the vagina, bleeding between periods or after sex, and pain during sex.  You are more likely to develop this condition if you have unprotected sex, have many sexual partners, or have a history of STIs.  This condition may be treated with antibiotic or antiviral medicines or by stopping use of items that cause irritation. This information is not intended to replace advice given to you by your health care provider. Make sure you discuss any questions you have with your health care provider. Document Released: 06/21/2005 Document Revised: 03/06/2016 Document Reviewed: 03/06/2016 Elsevier Interactive Patient Education  2017 [employer]    IF you received an x-ray today, you will receive an invoice from [facility]. Please contact [facility] at [phone] with questions or concerns regarding your invoice.   IF you received labwork today, you will receive an invoice from [facility]. Please contact [facility] at [phone] with questions or concerns regarding your invoice.   Our billing staff will not be able to assist you with questions regarding bills from these companies.  You will be contacted with the lab results as soon as they are available. The fastest way to get your results is to activate your My Chart account. Instructions are located on the last page of this paperwork. If you have not heard from us regarding the results in 2 weeks, please contact this office.

## 2017-03-12 NOTE — Progress Notes (Addendum)
Subjective:  By signing my name below, I, Stann Ore, attest that this documentation has been prepared under the direction and in the presence of Meredith Staggers, MD. Electronically Signed: Stann Ore, Scribe. 03/12/2017 , 3:15 PM .  Patient was seen in Room 10 .   Patient ID: Jasmine Hurley, female    DOB: [birth year], 21 y.o.   MRN: [id number] Chief Complaint  Patient presents with  . Vaginal Discharge    x 2 weeks    HPI Jasmine Hurley is a 21 y.o. female  Patient complains having thick vaginal discharge, white and green in color, that's been intermittent since the beginning of August. She describes the discharge appears white on her underwear. Her last period was a week ago; lasted longer than usual at 2.5 weeks, but normal.   She is sexually active, with males. She had a new partner recently, with no condoms. She denies history of STI. She denies STI testing. She denies vaginal pain, abdominal pain or trouble with urination. She has an implant for contraception.   Her sister had a baby boy today.   Patient Active Problem List   Diagnosis Date Noted  . Thyromegaly 01/15/2016   History reviewed. No pertinent past medical history. History reviewed. No pertinent surgical history. No Known Allergies Prior to Admission medications   Medication Sig Start Date End Date Taking? Authorizing Provider  etonogestrel (NEXPLANON) 68 MG IMPL implant 1 each by Subdermal route once.   Yes [provider]   Social History   Social History  . Marital status: Single    Spouse name: N/A  . Number of children: N/A  . Years of education: N/A   Occupational History  . Not on file.   Social History Main Topics  . Smoking status: Never Smoker  . Smokeless tobacco: Never Used  . Alcohol use No  . Drug use: No  . Sexual activity: Not on file   Other Topics Concern  . Not on file   Social History Narrative  . No narrative on file   Review of Systems  Constitutional:  Negative for chills, fatigue, fever and unexpected weight change.  Respiratory: Negative for cough.   Gastrointestinal: Negative for abdominal pain, constipation, diarrhea, nausea and vomiting.  Genitourinary: Positive for vaginal discharge. Negative for difficulty urinating, dysuria, hematuria and vaginal pain.  Skin: Negative for rash and wound.  Neurological: Negative for dizziness, weakness and headaches.       Objective:   Physical Exam  Constitutional: She is oriented to person, place, and time. She appears well-developed and well-nourished. No distress.  HENT:  Head: Normocephalic and atraumatic.  Eyes: Pupils are equal, round, and reactive to light. EOM are normal.  Neck: Neck supple.  Cardiovascular: Normal rate.   Pulmonary/Chest: Effort normal. No respiratory distress.  Abdominal: There is tenderness (minimal) in the left lower quadrant.  Genitourinary: There is no rash, tenderness, lesion or injury on the right labia. There is no rash, tenderness, lesion or injury on the left labia. Uterus is not tender. Cervix exhibits discharge. Cervix exhibits no motion tenderness (Discomfort with exam, possible slight CMT, no adnexal ttp. d/c at cervix. ). Right adnexum displays no tenderness and no fullness. Left adnexum displays no tenderness and no fullness. No erythema or bleeding in the vagina. No foreign body in the vagina. No signs of injury around the vagina. Vaginal discharge (White/yellow discharge.) found.  Musculoskeletal: Normal range of motion.  Neurological: She is alert and oriented to person, place,  and time.  Skin: Skin is warm and dry.  Psychiatric: She has a normal mood and affect. Her behavior is normal.  Nursing note and vitals reviewed.   Vitals:   03/12/17 1435  BP: 122/82  Pulse: 77  Resp: 16  Temp: 98 F (36.7 C)  TempSrc: Oral  SpO2: 99%  Weight: 169 lb (76.7 kg)  Height: 5' 8.11" (1.73 m)   Results for orders placed or performed in visit on 03/12/17    POCT urine pregnancy  Result Value Ref Range   Preg Test, Ur Negative Negative  POCT Wet + KOH Prep  Result Value Ref Range   Yeast by KOH Absent Absent   Yeast by wet prep Absent Absent   WBC by wet prep Moderate (A) Few   Clue Cells Wet Prep HPF POC None None   Trich by wet prep Absent Absent   Bacteria Wet Prep HPF POC Few Few   Epithelial Cells By [employer] ([facility]) Few None, Few, Too numerous to count   RBC,UR,HPF,POC None None RBC/hpf      Assessment & Plan:    Jasmine Hurley is a 21 y.o. female Vaginal discharge - Plan: HIV antibody, RPR, GC/Chlamydia Probe Amp, POCT urine pregnancy, POCT Wet + KOH Prep, cefTRIAXone (ROCEPHIN) injection 250 mg, azithromycin (ZITHROMAX) 250 MG tablet History of unprotected sex - Plan: HIV antibody, RPR, GC/Chlamydia Probe Amp, POCT urine pregnancy, cefTRIAXone (ROCEPHIN) injection 250 mg, azithromycin (ZITHROMAX) 250 MG tablet  -Concerning for possible sexually transmitted infection with prior unprotected intercourse and persistent discolored vaginal discharge with overall reassuring wet prep. STI testing pending  -Discomfort with pelvic exam, exam, but no true adnexal tenderness, afebrile, doubt PID.  -Rocephin 250 mg injection, start azithromycin  1, RTC precautions if worsening.  Abnormal menses  -Negative hCG, contraception with next point on. If persistent irregular menses, return to discuss further.  Meds ordered this encounter  Medications  . cefTRIAXone (ROCEPHIN) injection 250 mg  . azithromycin (ZITHROMAX) 250 MG tablet    Sig: Take 4 tablets (1,000 mg total) by mouth once.    Dispense:  4 tablet    Refill:  0   Patient Instructions    Am concerned about a possible sexually transmitted infection. For that reason you were given a shot of antibiotic in the office to cover for gonorrhea, and take the for azithromycin pills to treat possible chlamydia. I will check those tests as well as HIV and syphilis tests, and you should  receive those results sometime next week.  Return to the clinic or go to the nearest emergency room if any of your symptoms worsen or new symptoms occur.   Cervicitis Cervicitis is irritation and swelling of the cervix. The cervix is the lower and narrow end of the uterus. It is the part of the uterus that opens up to the vagina. What are the causes? This condition may be caused by:  An STI (sexually transmitted infection), such as gonorrhea, chlamydia, or genital herpes.  Objects that are put in the vagina, such as tampons or birth control devices. This usually occurs if an object is left in for too long.  Chemical irritation or allergic reaction. This may be from vaginal douches, latex condoms, or contraceptive creams.  An injury to the cervix.  A bacterial infection.  Radiation therapy.  What increases the risk? You are more likely to develop this condition if:  You have unprotected sex.  You have sex with many partners.  You have  a new sexual partner.  You start having sex at an early age.  You have a history of STIs.  What are the signs or symptoms? Symptoms of this condition include:  Wallace Cullens, white, yellow, or bad-smelling vaginal discharge.  Pain or itchiness around the vagina.  Pain during sex.  Pain in the lower abdomen or lower back, especially during sex.  Urinating often.  Pain during urination.  Abnormal vaginal bleeding, such as bleeding between periods, after sex, or after menopause.  In some cases, there are no symptoms. How is this diagnosed? This condition may be diagnosed with:  A pelvic exam. Your health care provider will examine whether the cervix has an unusual discharge or bleeds easily when touched with a swab.  A wet prep. This is a test in which vaginal discharge is examined under a microscope to check for signs of infection.  A swab test of the cervix. For this test, sample cells from the cervix are collected on a swab and examined  under a microscope to check for signs of infection.  Urine tests.  How is this treated? Treatment for cervicitis depends on what is causing the condition. Treatment may include:  Antibiotic medicines. These are used to treat certain infections, including STIs like gonorrhea or chlamydia. If you are taking these medicines to treat an STI, your sexual partner may also need to take these medicines.  Antiviral medicines. These are used to treat herpes simplex virus. Your sexual partner may also need to take these medicines.  Stopping use of items that cause irritation, such as tampons, latex condoms, douches, or spermicides.  Follow these instructions at home:  Do not have sex until your health care provider says it is okay.  Take over-the-counter and prescription medicines only as told by your health care provider.  If you were prescribed an antibiotic, take it as told by your health care provider. Do not stop taking the antibiotic even if you start to feel better.  Keep all follow-up visits as told by your health care provider. This is important. Contact a health care provider if:  Your symptoms come back or get worse after treatment.  You have a fever.  You have fatigue.  You have pain in your abdomen.  You experience nausea, vomiting, or diarrhea.  You have back pain. Get help right away if:  You have severe abdominal pain that cannot be helped with medicine.  You cannot urinate. Summary  Cervicitis is irritation and swelling of the cervix.  This condition may be caused by an STI (sexually transmitted infection), an allergic reaction or chemical irritation, radiation therapy, or objects that are put in the vagina, such as tampons or diaphragms.  Symptoms of this condition can include unusual vaginal discharge, painful urination, irritation or pain around the vagina, bleeding between periods or after sex, and pain during sex.  You are more likely to develop this  condition if you have unprotected sex, have many sexual partners, or have a history of STIs.  This condition may be treated with antibiotic or antiviral medicines or by stopping use of items that cause irritation. This information is not intended to replace advice given to you by your health care provider. Make sure you discuss any questions you have with your health care provider. Document Released: 06/21/2005 Document Revised: 03/06/2016 Document Reviewed: 03/06/2016 Elsevier Interactive Patient Education  2017 [employer].    IF you received an x-ray today, you will receive an invoice from [facility]. Please contact  [facility] at [phone] with questions or concerns regarding your invoice.   IF you received labwork today, you will receive an invoice from [facility]. Please contact [facility] at [phone] with questions or concerns regarding your invoice.   Our billing staff will not be able to assist you with questions regarding bills from these companies.  You will be contacted with the lab results as soon as they are available. The fastest way to get your results is to activate your My Chart account. Instructions are located on the last page of this paperwork. If you have not heard from us regarding the results in 2 weeks, please contact this office.       I personally performed the services described in this documentation, which was scribed in my presence. The recorded information has been reviewed and considered for accuracy and completeness, addended by me as needed, and agree with information above.  Signed,   Meredith StaggersJeffrey Criston Chancellor, MD Primary Care at [facility].  03/13/17 3:38 PM

## 2017-03-13 LAB — HIV ANTIBODY (ROUTINE TESTING W REFLEX): HIV Screen 4th Generation wRfx: NONREACTIVE

## 2017-03-13 LAB — RPR: RPR: NONREACTIVE

## 2017-03-15 LAB — GC/CHLAMYDIA PROBE AMP
CHLAMYDIA, DNA PROBE: POSITIVE — AB
Neisseria gonorrhoeae by PCR: NEGATIVE

## 2017-03-24 ENCOUNTER — Ambulatory Visit: Payer: 59 | Admitting: Family Medicine

## 2017-03-26 ENCOUNTER — Ambulatory Visit (INDEPENDENT_AMBULATORY_CARE_PROVIDER_SITE_OTHER): Payer: 59 | Admitting: Family Medicine

## 2017-03-26 DIAGNOSIS — Z23 Encounter for immunization: Secondary | ICD-10-CM

## 2017-03-28 ENCOUNTER — Ambulatory Visit: Payer: 59 | Admitting: Family Medicine

## 2017-03-29 NOTE — Progress Notes (Signed)
Immunization only. Not seen by a provider.

## 2017-04-04 ENCOUNTER — Ambulatory Visit: Payer: 59 | Admitting: Family Medicine

## 2017-05-26 IMAGING — US US SOFT TISSUE HEAD/NECK
1 series · 13 of 25 positions shown · non-contrast
Comparison: None available all ule

CLINICAL DATA: Thyromegaly on exam

EXAM:
THYROID ULTRASOUND
TECHNIQUE: Ultrasound examination of the thyroid gland and adjacent soft
tissues was performed.

[Series 1: us soft tissue head/neck · 0.06mm/px · 13 of 39 slices shown]
[im 1/39]
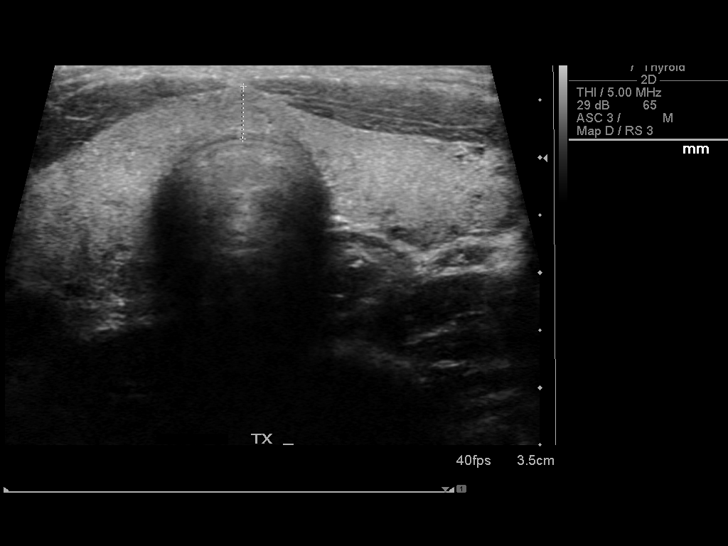
[im 4/39]
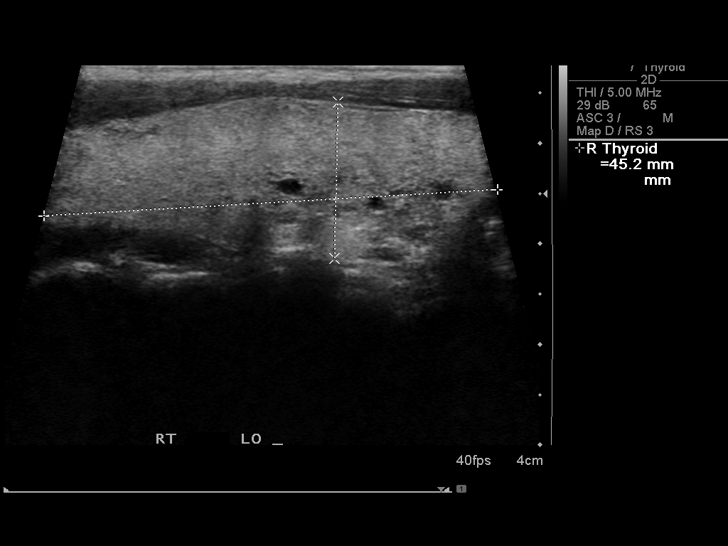
[im 7/39]
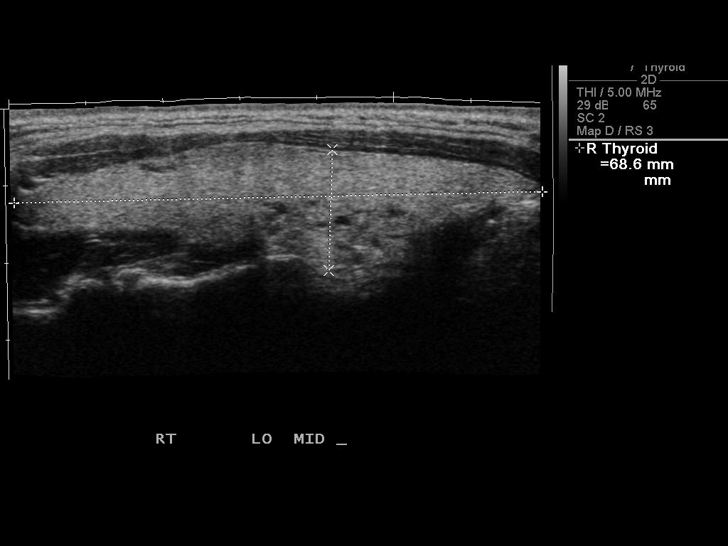
[im 10/39]
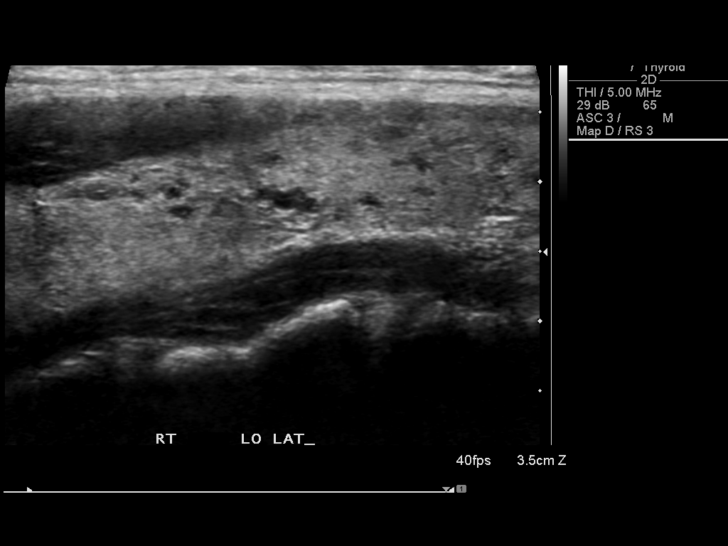
[im 13/39]
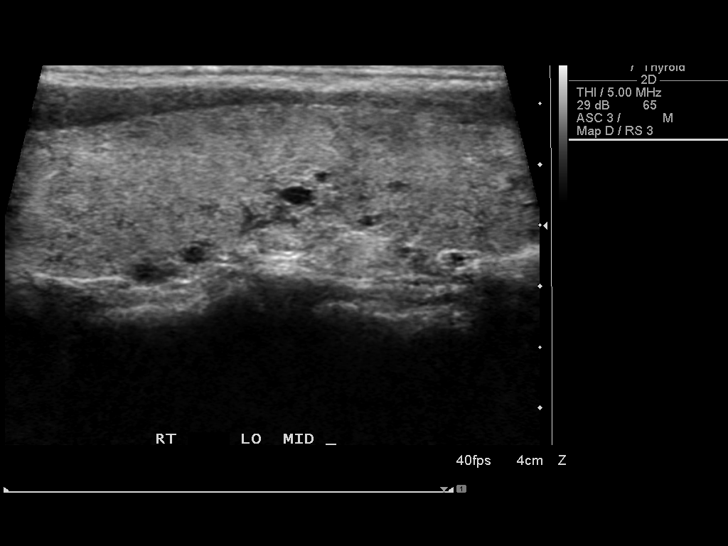
[im 16/39]
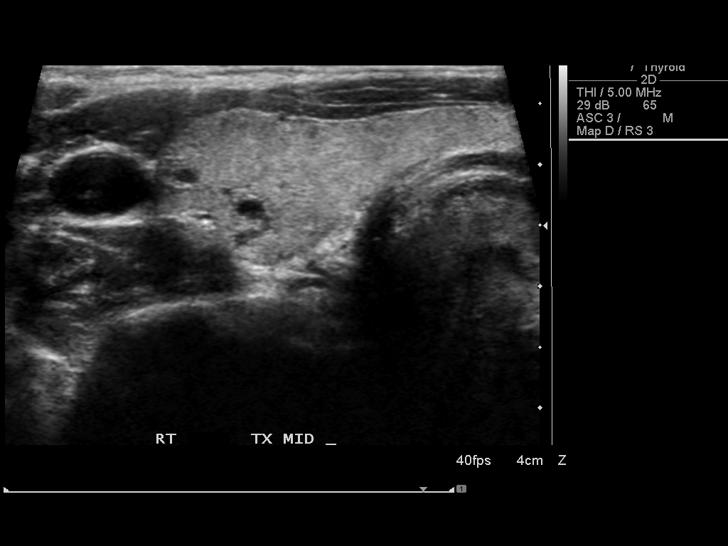
[im 20/39]
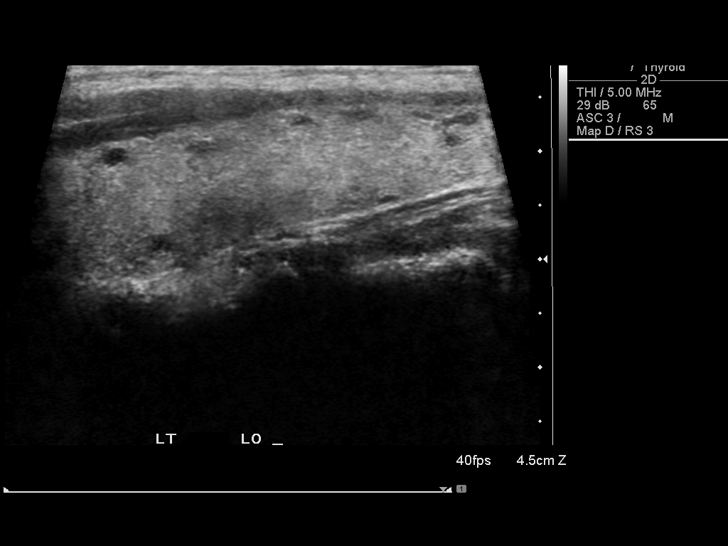
[im 23/39]
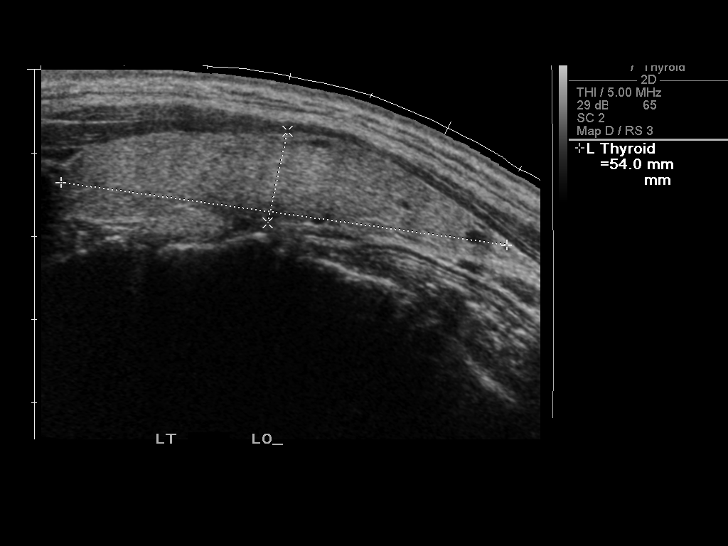
[im 26/39]
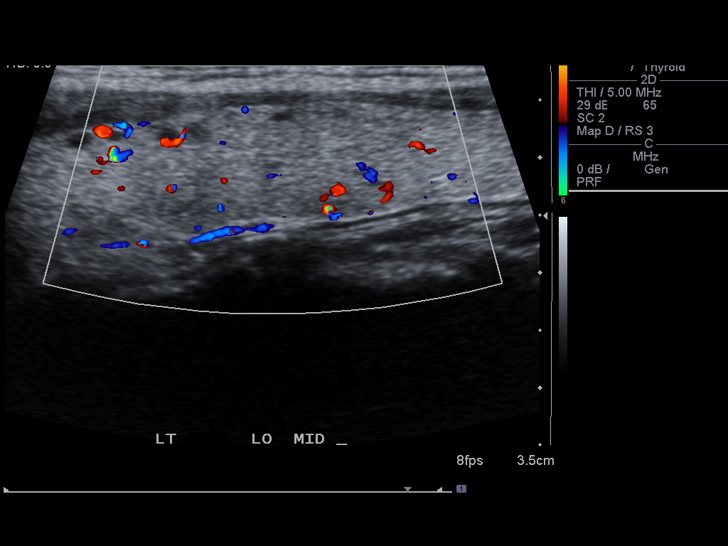
[im 29/39]
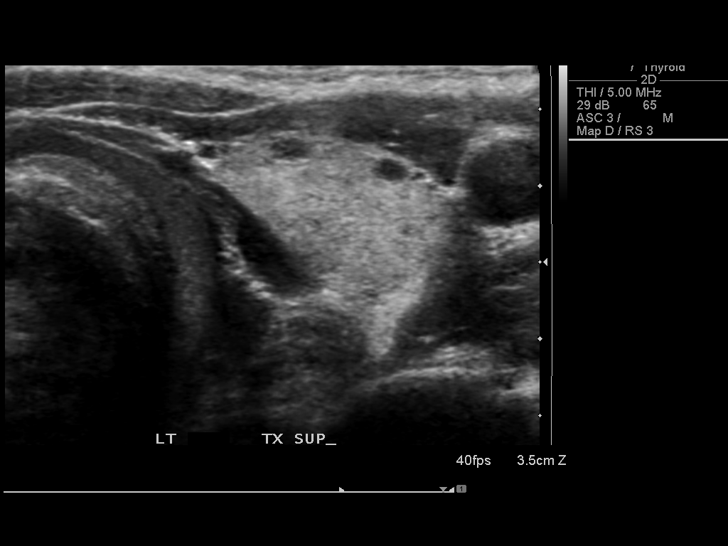
[im 32/39]
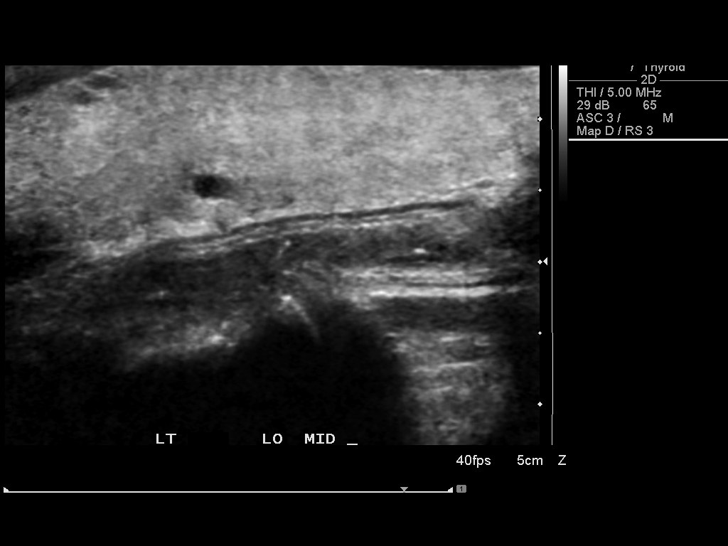
[im 35/39]
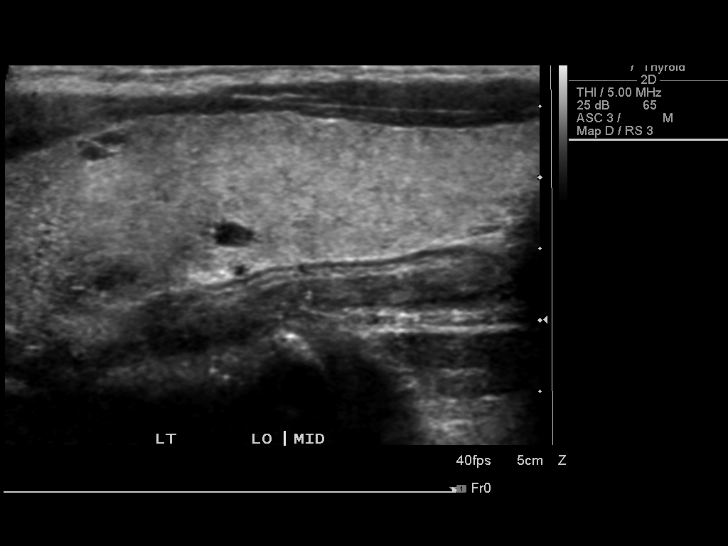
[im 39/39]
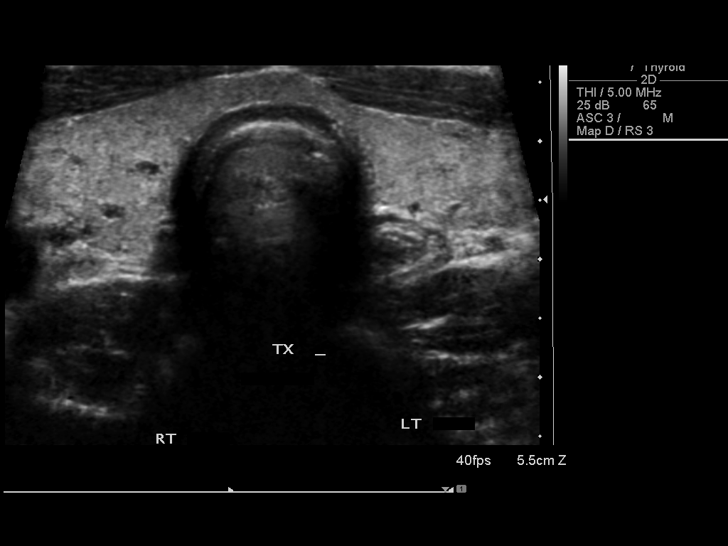

[13 of 25 positions shown; findings below may reference images not displayed]

FINDINGS: Right thyroid lobe

Measurements: 6.9 x 1.6 x 1.8 cm. Homogeneous background
echotexture. Scattered tiny solid and cystic hypoechoic nodules
throughout the right lobe all measuring 2.5 mm or less in size. No
other significant right thyroid abnormality.

Left thyroid lobe

Measurements: 5.4 x 1.1 x 2.0 cm. Homogeneous background
echotexture. Similar tiny solid and cystic hypoechoic nodules in the
left lobe also all measuring less than 3 mm. No other significant
left thyroid abnormality.

Isthmus

Thickness: 5 mm.  No nodules visualized.

Lymphadenopathy

None visualized.
IMPRESSION: Tiny bilateral hypoechoic solid and cystic thyroid nodules all
measuring 3 mm or less in size. No other significant thyroid
abnormality.

Findings do not meet current SRU consensus criteria for biopsy.
Follow-up by clinical exam is recommended. If patient has known risk
factors for thyroid carcinoma, consider follow-up ultrasound in 12
months. If patient is clinically hyperthyroid, consider nuclear
medicine thyroid uptake and scan.Reference: Management of Thyroid
Nodules Detected at US: Society of Radiologists in Ultrasound

## 2017-05-26 IMAGING — US US PELVIS COMPLETE
1 series · 14 of 25 positions shown · non-contrast
Comparison: None.

CLINICAL DATA: Intermittent irregular vaginal bleeding, left
adnexal pain for at least a month.

EXAM:
TRANSABDOMINAL ULTRASOUND OF PELVIS
TECHNIQUE: Transabdominal ultrasound examination of the pelvis was performed
including evaluation of the uterus, ovaries, adnexal regions, and
pelvic cul-de-sac.

[Series 1: us pelvis complete · 0.20mm/px · 14 of 25 slices shown]
[im 1/25]
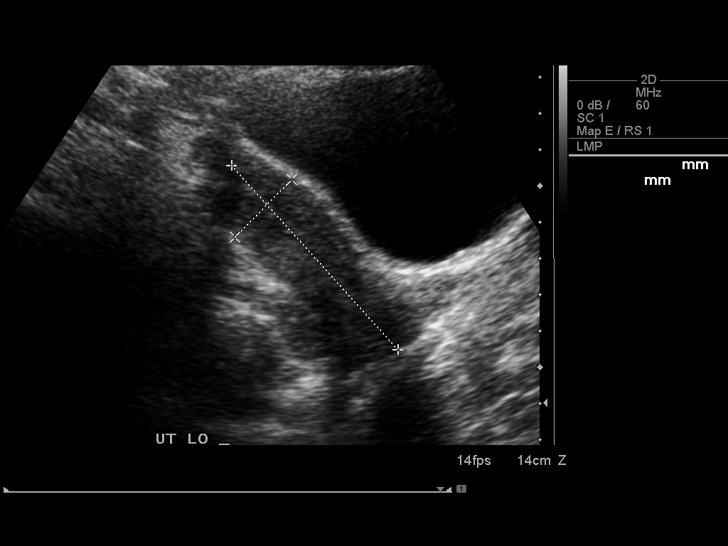
[im 3/25]
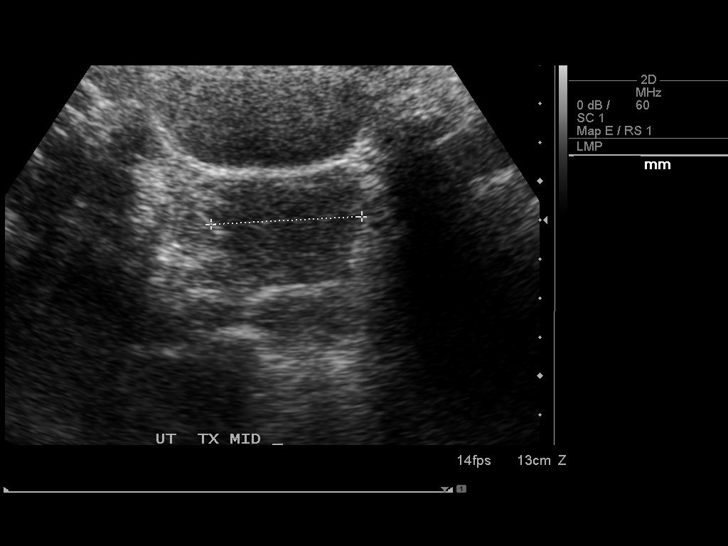
[im 5/25]
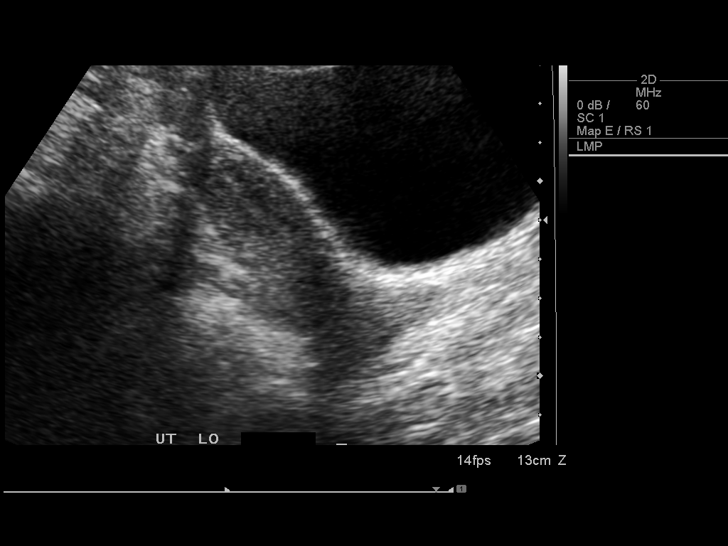
[im 7/25]
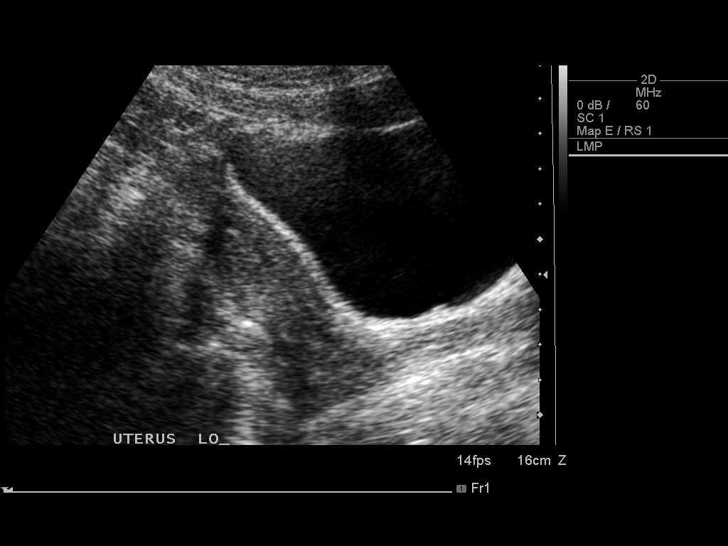
[im 9/25]
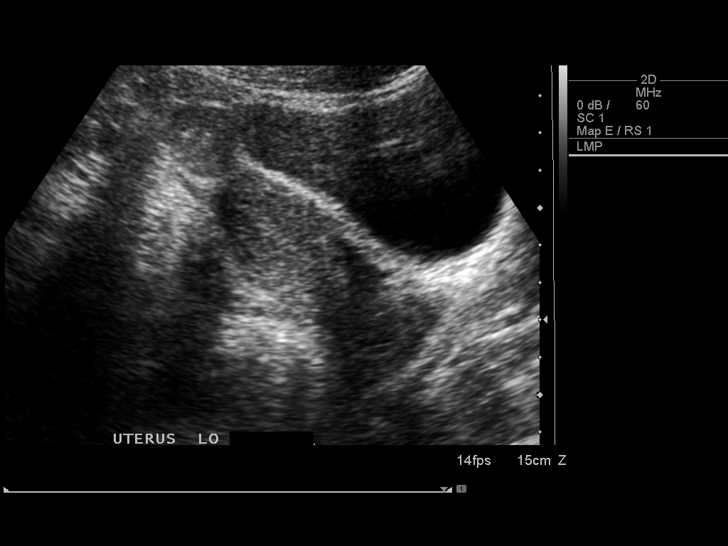
[im 10/25]
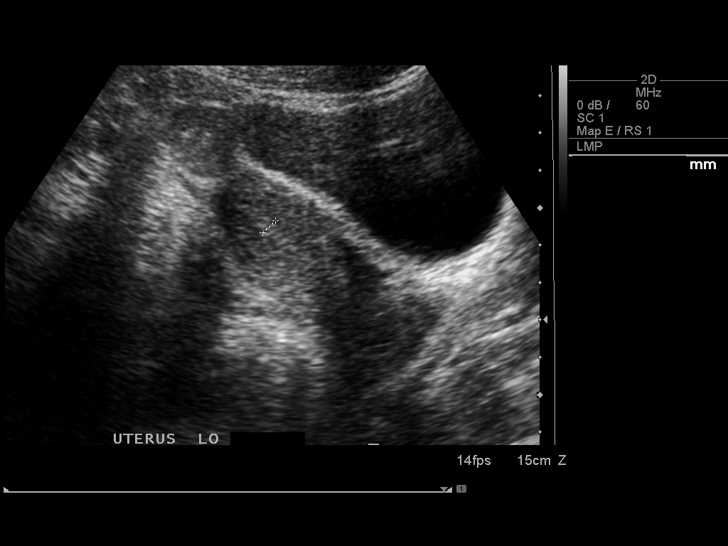
[im 12/25]
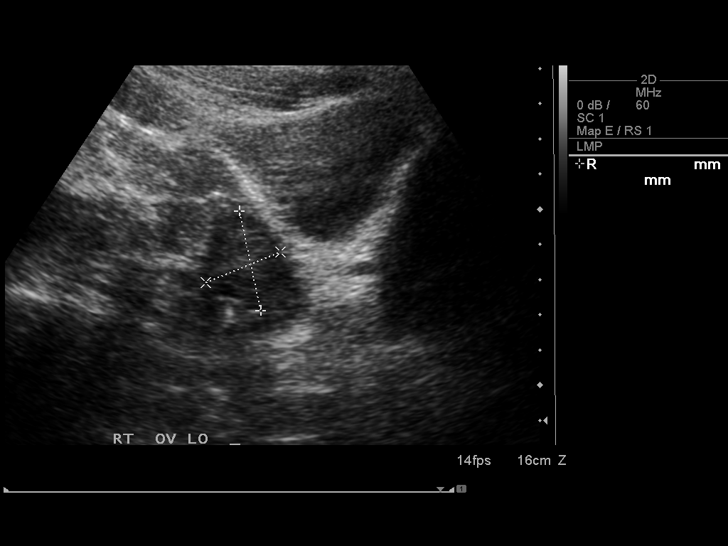
[im 14/25]
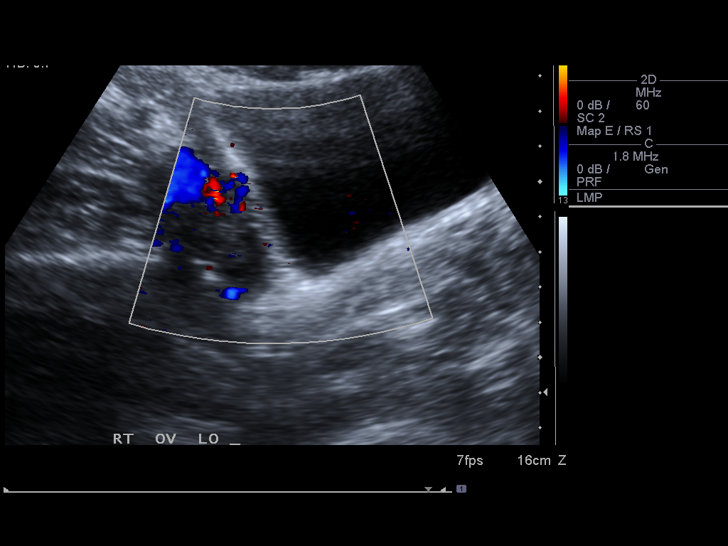
[im 16/25]
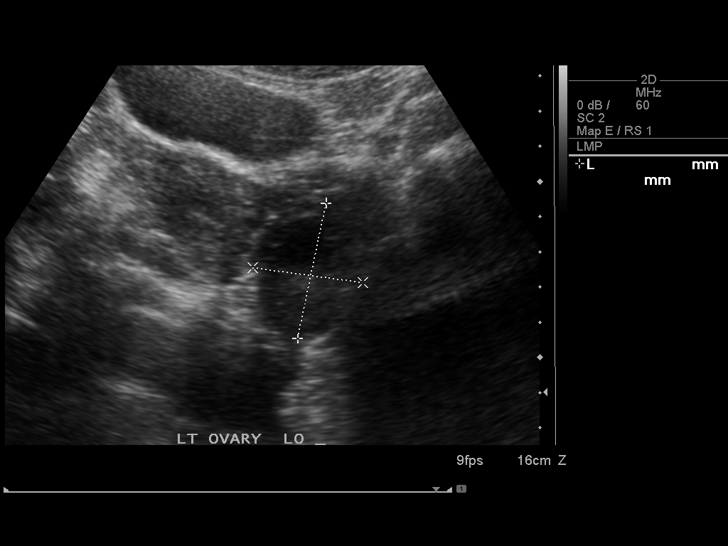
[im 17/25]
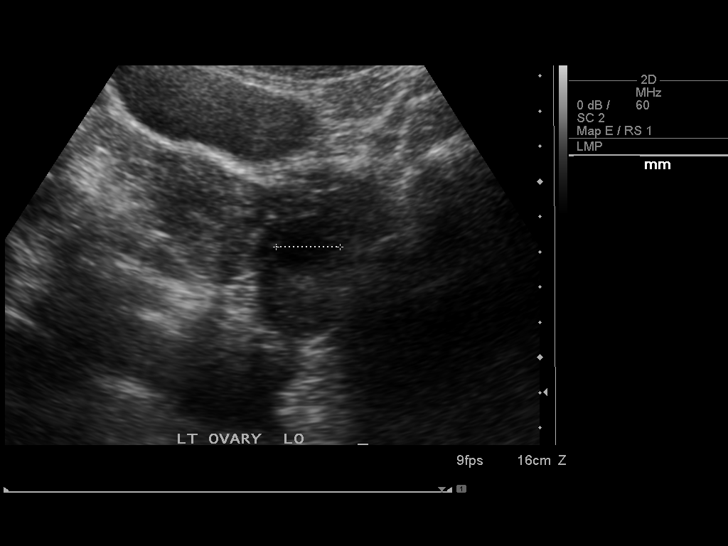
[im 19/25]
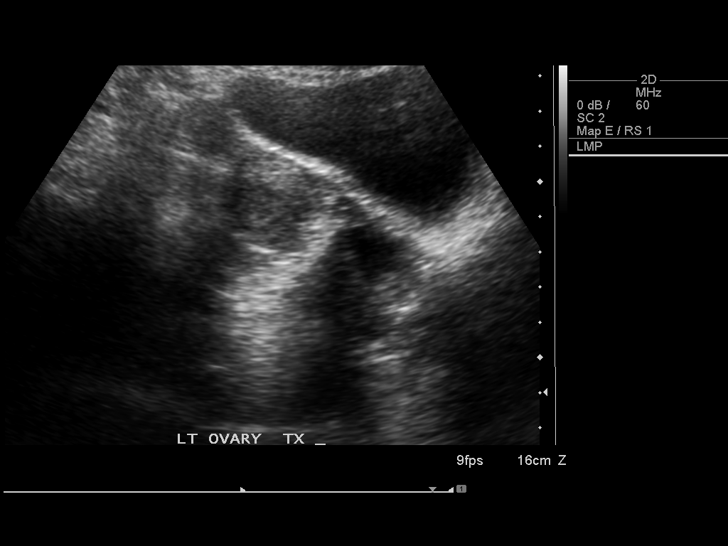
[im 21/25]
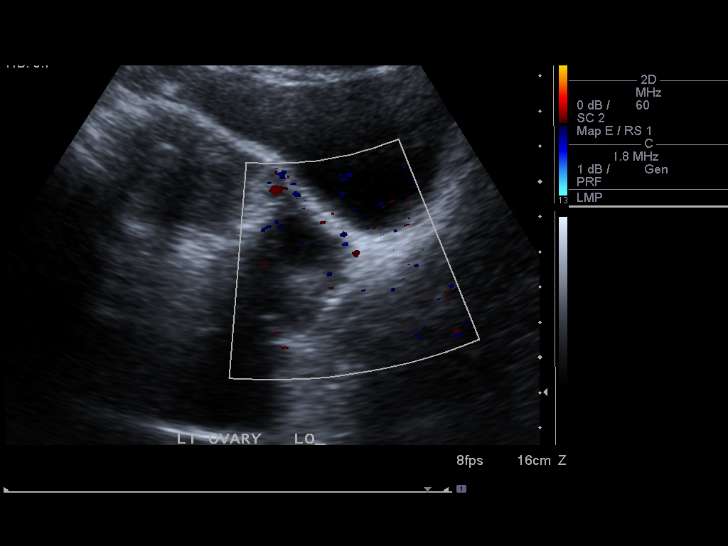
[im 23/25]
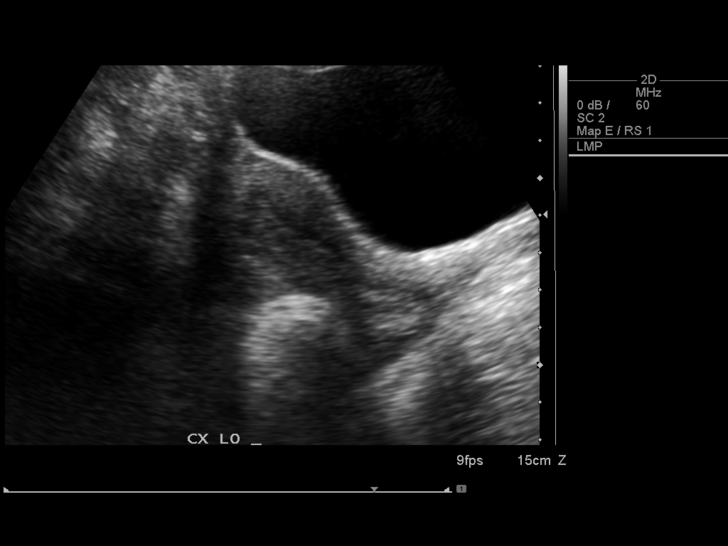
[im 25/25]
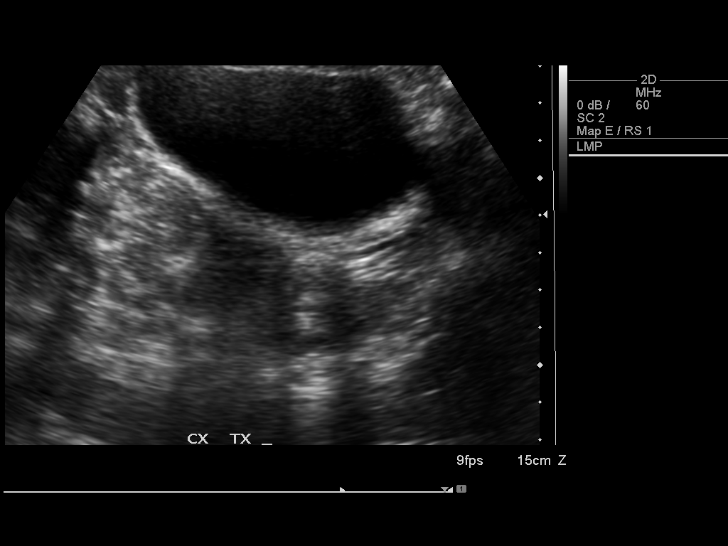

[14 of 25 positions shown; findings below may reference images not displayed]

FINDINGS: Uterus

Measurements: 6.8 x 2.2 x 3.9 cm. No fibroids or other mass
visualized.

Endometrium

Thickness: Normal at 5 mm. Endometrial complex appears homogeneous
in thickness throughout. No mass or fluid seen within the
endometrial canal.

Right ovary

Measurements: 2.9 x 2.3 x 1.8 cm. Normal appearance/no adnexal mass.

Left ovary

Measurements: 3.9 x 2.2 x 2.3 cm. Normal appearance/no adnexal mass.

Other findings:  No abnormal free fluid.
IMPRESSION: Normal transabdominal pelvic ultrasound.

## 2017-06-13 ENCOUNTER — Ambulatory Visit: Payer: 59 | Admitting: Family Medicine

## 2017-06-25 ENCOUNTER — Ambulatory Visit: Payer: 59 | Admitting: Physician Assistant

## 2017-07-12 ENCOUNTER — Ambulatory Visit: Payer: 59 | Admitting: Family Medicine

## 2017-07-19 ENCOUNTER — Ambulatory Visit: Payer: 59 | Admitting: Family Medicine

## 2017-07-23 ENCOUNTER — Ambulatory Visit: Payer: 59 | Admitting: Physician Assistant

## 2017-08-23 ENCOUNTER — Ambulatory Visit (INDEPENDENT_AMBULATORY_CARE_PROVIDER_SITE_OTHER): Payer: 59 | Admitting: Physician Assistant

## 2017-08-23 ENCOUNTER — Encounter: Payer: Self-pay | Admitting: Physician Assistant

## 2017-08-23 ENCOUNTER — Other Ambulatory Visit: Payer: Self-pay

## 2017-08-23 VITALS — HR 76 | Resp 16 | Ht 65.75 in | Wt 175.0 lb

## 2017-08-23 DIAGNOSIS — N760 Acute vaginitis: Secondary | ICD-10-CM | POA: Diagnosis not present

## 2017-08-23 DIAGNOSIS — Z113 Encounter for screening for infections with a predominantly sexual mode of transmission: Secondary | ICD-10-CM | POA: Diagnosis not present

## 2017-08-23 DIAGNOSIS — N898 Other specified noninflammatory disorders of vagina: Secondary | ICD-10-CM | POA: Diagnosis not present

## 2017-08-23 DIAGNOSIS — B9689 Other specified bacterial agents as the cause of diseases classified elsewhere: Secondary | ICD-10-CM | POA: Diagnosis not present

## 2017-08-23 LAB — POCT URINALYSIS DIPSTICK
Bilirubin, UA: NEGATIVE
GLUCOSE UA: NEGATIVE
Ketones, UA: NEGATIVE
Leukocytes, UA: NEGATIVE
NITRITE UA: NEGATIVE
PROTEIN UA: NEGATIVE
SPEC GRAV UA: 1.02 (ref 1.010–1.025)
Urobilinogen, UA: 0.2 E.U./dL
pH, UA: 6 (ref 5.0–8.0)

## 2017-08-23 LAB — POCT WET + KOH PREP
TRICH BY WET PREP: ABSENT
YEAST BY KOH: ABSENT
YEAST BY WET PREP: ABSENT

## 2017-08-23 LAB — POCT URINE PREGNANCY: PREG TEST UR: NEGATIVE

## 2017-08-23 MED ORDER — METRONIDAZOLE 500 MG PO TABS: 500.0000 mg | ORAL_TABLET | Freq: Two times a day (BID) | ORAL | 0 refills | Status: DC

## 2017-08-23 NOTE — Patient Instructions (Addendum)
The wet prep shows bacterial vaginosis.  This is not a classic STD.  I am giving you medication to treat this and it will go away.  Do not drink alcohol while taking this medication.  I will contact you if any of your other labs come back positive.  If everything is negative you can expect a letter.  If your symptoms persist I would like to see you back in the office in about 2 weeks.    IF you received an x-ray today, you will receive an invoice from [facility]. Please contact [facility] at [phone] with questions or concerns regarding your invoice.   IF you received labwork today, you will receive an invoice from [facility]. Please contact [facility] at [phone] with questions or concerns regarding your invoice.   Our billing staff will not be able to assist you with questions regarding bills from these companies.  You will be contacted with the lab results as soon as they are available. The fastest way to get your results is to activate your My Chart account. Instructions are located on the last page of this paperwork. If you have not heard from us regarding the results in 2 weeks, please contact this office.

## 2017-08-23 NOTE — Progress Notes (Signed)
08/23/2017 5:21 PM   DOB: 1996-05-20 / MRN: [id number]  SUBJECTIVE:  Jasmine Hurley is a 22 y.o. female  with a history of chlamydia diagnosed 4 months ago here for vaginal discharge and abdominal pain.  She describes a vaginal discharge as white and thin.  She denies an odor.  She has Nexplanon implant which was placed in December 2016.  She associates some lower abdominal pain and pressure that does not coincide with menses.  She has no burning with  urination denies urgency and frequency.  She tells me she has been sexually inactive since the last time she was diagnosed with chlamydia  Which was diagnosed here by Dr. Neva SeatGreene.   She has No Known Allergies.   She  has no past medical history on file.    She  reports that  has never smoked. she has never used smokeless tobacco. She reports that she does not drink alcohol or use drugs. She  has no sexual activity history on file. The patient  has no past surgical history on file.  Her family history includes Hypertension in her father and mother.  Review of Systems  Gastrointestinal: Positive for abdominal pain. Negative for blood in stool, constipation, diarrhea, melena, nausea and vomiting.  Genitourinary: Negative for dysuria, flank pain, frequency, hematuria and urgency.  Musculoskeletal: Negative for myalgias.    The problem list and medications were reviewed and updated by myself where necessary and exist elsewhere in the encounter.   OBJECTIVE:  Pulse 76   Resp 16   Ht 5' 5.75" (1.67 m)   Wt 175 lb (79.4 kg)   LMP 07/18/2017   BMI 28.46 kg/m   Physical Exam  Constitutional: She is active.  Non-toxic appearance.  Cardiovascular: Normal rate, regular rhythm, normal heart sounds and intact distal pulses. Exam reveals no gallop and no friction rub.  No murmur heard. Pulmonary/Chest: Effort normal. No tachypnea.  Abdominal: Soft. Bowel sounds are normal. She exhibits no distension and no mass. There is no tenderness. There is  no rebound and no guarding. Hernia confirmed negative in the right inguinal area and confirmed negative in the left inguinal area.  Genitourinary: No labial fusion. There is no rash, tenderness, lesion or injury on the right labia. There is no rash, tenderness, lesion or injury on the left labia. Right adnexum displays no mass, no tenderness and no fullness. Left adnexum displays no mass, no tenderness and no fullness. No erythema, tenderness or bleeding in the vagina. No foreign body in the vagina. No signs of injury around the vagina. No vaginal discharge found.    Musculoskeletal: Normal range of motion.  Lymphadenopathy:       Right: No inguinal adenopathy present.       Left: No inguinal adenopathy present.  Neurological: She is alert.  Skin: Skin is warm and dry. She is not diaphoretic. No pallor.    Results for orders placed or performed in visit on 08/23/17 (from the past 72 hour(s))  POCT urinalysis dipstick     Status: None   Collection Time: 08/23/17  5:02 PM  Result Value Ref Range   Color, UA yellow    Clarity, UA clear    Glucose, UA negative    Bilirubin, UA negative    Ketones, UA negative    Spec Grav, UA 1.020 1.010 - 1.025   Blood, UA trace-lysed    pH, UA 6.0 5.0 - 8.0   Protein, UA negative    Urobilinogen, UA 0.2  0.2 or 1.0 E.U./dL   Nitrite, UA negative    Leukocytes, UA Negative Negative   Appearance     Odor    POCT Wet + KOH Prep     Status: Abnormal   Collection Time: 08/23/17  5:06 PM  Result Value Ref Range   Yeast by KOH Absent Absent   Yeast by wet prep Absent Absent   WBC by wet prep Moderate (A) Few   Clue Cells Wet Prep HPF POC Many (A) None   Trich by wet prep Absent Absent   Bacteria Wet Prep HPF POC Too numerous to count  (A) Few   Epithelial Cells By [employer] ([facility]) None None, Few, Too numerous to count   RBC,UR,HPF,POC None None RBC/hpf  POCT urine pregnancy     Status: None   Collection Time: 08/23/17  5:08 PM  Result Value Ref Range     Preg Test, Ur Negative Negative    No results found.  ASSESSMENT AND PLAN:  Dagmar was seen today for vaginal discharge.  Diagnoses and all orders for this visit:  Vaginal discharge -     POCT Wet + KOH Prep -     POCT urinalysis dipstick -     Cancel: POCT wet + KOH prep -     POCT urine pregnancy -     Pap IG, CT/NG w/ reflex HPV when ASC-U  Routine screening for STI (sexually transmitted infection) -     HIV antibody -     RPR -     Cancel: GC/Chlamydia Probe Amp  BV (bacterial vaginosis) -     metroNIDAZOLE (FLAGYL) 500 MG tablet; Take 1 tablet (500 mg total) by mouth 2 (two) times daily. Do not drink alcohol while taking this medication.    The patient is advised to call or return to clinic if she does not see an improvement in symptoms, or to seek the care of the closest emergency department if she worsens with the above plan.   Deliah Boston, MHS, PA-C Primary Care at [facility] 08/23/2017 5:21 PM

## 2017-08-24 LAB — GC/CHLAMYDIA PROBE AMP
Chlamydia trachomatis, NAA: NEGATIVE
Neisseria gonorrhoeae by PCR: NEGATIVE

## 2017-08-24 LAB — RPR: RPR: NONREACTIVE

## 2017-08-24 LAB — HIV ANTIBODY (ROUTINE TESTING W REFLEX): HIV SCREEN 4TH GENERATION: NONREACTIVE

## 2017-08-25 LAB — PAP IG, CT-NG, RFX HPV ASCU
CHLAMYDIA, NUC. ACID AMP: NEGATIVE
Gonococcus by Nucleic Acid Amp: NEGATIVE
PAP Smear Comment: 0

## 2017-09-28 ENCOUNTER — Encounter (HOSPITAL_COMMUNITY): Payer: Self-pay | Admitting: *Deleted

## 2017-09-28 ENCOUNTER — Emergency Department (HOSPITAL_COMMUNITY): Admission: EM | Admit: 2017-09-28 | Discharge: 2017-09-28 | Discharge status: Absent without leave | Payer: 59

## 2017-09-28 ENCOUNTER — Other Ambulatory Visit: Payer: Self-pay

## 2017-09-28 ENCOUNTER — Emergency Department (HOSPITAL_COMMUNITY)
Admission: EM | Admit: 2017-09-28 | Discharge: 2017-09-28 | Disposition: A | Discharge status: Home / Self Care | Payer: 59 | Attending: Emergency Medicine | Admitting: Emergency Medicine

## 2017-09-28 DIAGNOSIS — L03113 Cellulitis of right upper limb: Secondary | ICD-10-CM

## 2017-09-28 DIAGNOSIS — R2231 Localized swelling, mass and lump, right upper limb: Secondary | ICD-10-CM | POA: Diagnosis not present

## 2017-09-28 DIAGNOSIS — I891 Lymphangitis: Secondary | ICD-10-CM | POA: Diagnosis not present

## 2017-09-28 MED ORDER — CLINDAMYCIN HCL 150 MG PO CAPS
300.0000 mg | ORAL_CAPSULE | Freq: Once | ORAL | Status: AC
  Administered 2017-09-28: 300 mg via ORAL
  Filled 2017-09-28: qty 2

## 2017-09-28 MED ORDER — CLINDAMYCIN HCL 150 MG PO CAPS: 300.0000 mg | ORAL_CAPSULE | Freq: Four times a day (QID) | ORAL | 0 refills | Status: DC

## 2017-09-28 NOTE — ED Triage Notes (Signed)
Pt was bit by something yesterday,  R wrist and forearm swollen with red streaks up arm.

## 2017-09-28 NOTE — ED Provider Notes (Signed)
[facility] EMERGENCY DEPARTMENT Provider Note   CSN: [id number] Arrival date & time: 09/28/17  0209     History   Chief Complaint Chief Complaint  Patient presents with  . Allergic Reaction    HPI Jasmine Hurley is a 22 y.o. female.  Patient with no significant PMH, presents with c/o 'bug bite' to the R hand yesterday. Since yesterday she has noticed worsening swelling on the back of the hand and wrist with development of red streaking up to her right axilla.  She has had no associated fevers or chills, nausea or vomiting.  No treatments prior to arrival.  She did not see or feel a bite but noticed a bump on the back of her wrist. The onset of this condition was acute. The course is gradually worsening. Aggravating factors: none. Alleviating factors: none.       History reviewed. No pertinent past medical history.  Patient Active Problem List   Diagnosis Date Noted  . Thyromegaly 01/15/2016    History reviewed. No pertinent surgical history.   OB History   None      Home Medications    Prior to Admission medications   Medication Sig Start Date End Date Taking? Authorizing Provider  etonogestrel (NEXPLANON) 68 MG IMPL implant 1 each by Subdermal route once.    [provider]  metroNIDAZOLE (FLAGYL) 500 MG tablet Take 1 tablet (500 mg total) by mouth 2 (two) times daily. Do not drink alcohol while taking this medication. 08/23/17   Ofilia Neas, PA-C    Family History Family History  Problem Relation Age of Onset  . Hypertension Mother   . Hypertension Father     Social History Social History   Tobacco Use  . Smoking status: Never Smoker  . Smokeless tobacco: Never Used  Substance Use Topics  . Alcohol use: No  . Drug use: No     Allergies   Patient has no known allergies.   Review of Systems Review of Systems  Constitutional: Negative for chills and fever.  Gastrointestinal: Negative for nausea and vomiting.    Musculoskeletal: Negative for myalgias.  Skin: Positive for color change. Negative for wound.  Neurological: Negative for headaches.     Physical Exam Updated Vital Signs BP 139/88 (BP Location: Right Arm)   Pulse 83   Temp 98.3 F (36.8 C) (Oral)   Resp 16   Ht 5\' 6"  (1.676 m)   Wt 80.2 kg (176 lb 12.9 oz)   LMP 09/05/2017   SpO2 100%   BMI 28.54 kg/m   Physical Exam  Constitutional: She appears well-developed and well-nourished.  HENT:  Head: Normocephalic and atraumatic.  Eyes: Conjunctivae are normal.  Neck: Normal range of motion. Neck supple.  Pulmonary/Chest: No respiratory distress.  Neurological: She is alert.  Skin: Skin is warm and dry.  Patient with a small papule noted on the dorsal aspect of the right wrist without fluctuance or active drainage.  Patient has mild generalized edema of the dorsal aspect of the right hand and wrist without any decreased range of motion or pain with movement.  She is noted to have lymphangitis spread from the wrist proximally along the forearm and upper arm.  Psychiatric: She has a normal mood and affect.  Nursing note and vitals reviewed.    ED Treatments / Results  Labs (all labs ordered are listed, but only abnormal results are displayed) Labs Reviewed - No data to display  EKG None  Radiology  No results found.  Procedures Procedures (including critical care time)  Medications Ordered in ED Medications  clindamycin (CLEOCIN) capsule 300 mg (300 mg Oral Given 09/28/17 0815)     Initial Impression / Assessment and Plan / ED Course  I have reviewed the triage vital signs and the nursing notes.  Pertinent labs & imaging results that were available during my care of the patient were reviewed by me and considered in my medical decision making (see chart for details).     Patient seen and examined. Medications ordered.   Vital signs reviewed and are as follows: BP 139/88 (BP Location: Right Arm)   Pulse 83    Temp 98.3 F (36.8 C) (Oral)   Resp 16   Ht 5\' 6"  (1.676 m)   Wt 80.2 kg (176 lb 12.9 oz)   LMP 09/05/2017   SpO2 100%   BMI 28.54 kg/m   Recheck vitals, start clinda PO. [facility] can [facility] tomorrow for a recheck.   Anticipate d/c home after vitals recheck. Pt urged to return with worsening pain, worsening swelling, expanding area of redness, fever, or any other concerns.Urged to take complete course of antibiotics as prescribed. Pt verbalizes understanding and agrees with plan.  8:27 AM vital signs remain normal.  We will discharged home.  BP 134/70   Pulse 88   Temp 98.2 F (36.8 C)   Resp 18   Ht 5\' 6"  (1.676 m)   Wt 80.2 kg (176 lb 12.9 oz)   LMP 09/05/2017   SpO2 99%   BMI 28.54 kg/m    Final Clinical Impressions(s) / ED Diagnoses   Final diagnoses:  Lymphangitis  Cellulitis of right upper extremity   Patient with cellulitis with lymphangitic spread of the right upper extremity.  Patient without fever or other systemic signs of symptoms.  No concern for septic arthritis given normal range of motion of the entire upper extremity without pain.  No indications for admission at this time, however feel patient requires close follow-up and recheck in 24 hours.  She may do this by returning to the emergency department or going to [place] to see her doctor.  Home with return instructions as above and prescription for clindamycin.  ED Discharge Orders        Ordered    clindamycin (CLEOCIN) 150 MG capsule  Every 6 hours     09/28/17 0826       Renne CriglerGeiple, Lehua Flores, PA-C 09/28/17 [id number]    Margarita Grizzleay, Danielle, MD 09/29/17 1409

## 2017-09-28 NOTE — Discharge Instructions (Signed)
Please read and follow all provided instructions.  Your diagnoses today include:  1. Lymphangitis   2. Cellulitis of right upper extremity    Tests performed today include:  Vital signs. See below for your results today.   Medications prescribed:   Clindamycin - antibiotic  You have been prescribed an antibiotic medicine: take the entire course of medicine even if you are feeling better. Stopping early can cause the antibiotic not to work.  Take any prescribed medications only as directed.   Home care instructions:  Follow any educational materials contained in this packet. Keep affected area above the level of your heart when possible. Wash area gently twice a day with warm soapy water. Do not apply alcohol or hydrogen peroxide. Cover the area if it draining or weeping.   Follow-up instructions: Please follow-up with your primary care provider or return to the emergency department in 24 hours for further evaluation of your symptoms.   Return instructions:  Return to the Emergency Department if you have:  Fever  Worsening symptoms  Worsening pain  Worsening swelling  Redness of the skin that moves away from the affected area, especially if it streaks away from the affected area   Any other emergent concerns  Your vital signs today were: BP 134/70    Pulse 88    Temp 98.2 F (36.8 C)    Resp 18    Ht 5\' 6"  (1.676 m)    Wt 80.2 kg (176 lb 12.9 oz)    LMP 09/05/2017    SpO2 99%    BMI 28.54 kg/m  If your blood pressure (BP) was elevated above 135/85 this visit, please have this repeated by your doctor within one month. --------------

## 2017-09-29 ENCOUNTER — Ambulatory Visit: Payer: 59 | Admitting: Family Medicine

## 2018-01-13 ENCOUNTER — Ambulatory Visit: Payer: 59 | Admitting: Physician Assistant

## 2018-02-20 ENCOUNTER — Ambulatory Visit: Payer: 59

## 2018-02-21 ENCOUNTER — Ambulatory Visit: Payer: 59

## 2018-02-22 ENCOUNTER — Ambulatory Visit (INDEPENDENT_AMBULATORY_CARE_PROVIDER_SITE_OTHER): Payer: 59 | Admitting: Physician Assistant

## 2018-02-22 DIAGNOSIS — Z111 Encounter for screening for respiratory tuberculosis: Secondary | ICD-10-CM

## 2018-02-22 NOTE — Addendum Note (Signed)
Addended by: Golden HurterOBERTS, Rayshaun Needle on: 02/22/2018 04:57 PM   Modules accepted: Level of Service

## 2018-03-01 LAB — QUANTIFERON-TB GOLD PLUS
QUANTIFERON NIL VALUE: 0.04 [IU]/mL
QUANTIFERON TB2 AG VALUE: 0.03 [IU]/mL
QUANTIFERON-TB GOLD PLUS: NEGATIVE
QuantiFERON Mitogen Value: >10 IU/mL
QuantiFERON TB1 Ag Value: 0.03 IU/mL

## 2018-03-18 ENCOUNTER — Other Ambulatory Visit: Payer: Self-pay

## 2018-03-18 ENCOUNTER — Ambulatory Visit: Payer: 59 | Admitting: Family Medicine

## 2018-03-18 ENCOUNTER — Encounter: Payer: Self-pay | Admitting: Family Medicine

## 2018-03-18 VITALS — BP 125/75 | HR 88 | Temp 98.0°F | Resp 16 | Ht 65.0 in | Wt 171.0 lb

## 2018-03-18 DIAGNOSIS — N898 Other specified noninflammatory disorders of vagina: Secondary | ICD-10-CM | POA: Diagnosis not present

## 2018-03-18 DIAGNOSIS — M545 Low back pain, unspecified: Secondary | ICD-10-CM

## 2018-03-18 DIAGNOSIS — R102 Pelvic and perineal pain: Secondary | ICD-10-CM | POA: Diagnosis not present

## 2018-03-18 DIAGNOSIS — R109 Unspecified abdominal pain: Secondary | ICD-10-CM | POA: Diagnosis not present

## 2018-03-18 LAB — POCT WET + KOH PREP
Trich by wet prep: ABSENT
Yeast by KOH: ABSENT
Yeast by wet prep: ABSENT

## 2018-03-18 LAB — POCT URINALYSIS DIP (MANUAL ENTRY)
Bilirubin, UA: NEGATIVE
GLUCOSE UA: NEGATIVE mg/dL
Ketones, POC UA: NEGATIVE mg/dL
Leukocytes, UA: NEGATIVE
Nitrite, UA: NEGATIVE
PH UA: 7 (ref 5.0–8.0)
RBC UA: NEGATIVE
SPEC GRAV UA: 1.02 (ref 1.010–1.025)
UROBILINOGEN UA: 1 U/dL

## 2018-03-18 LAB — POCT URINE PREGNANCY: Preg Test, Ur: NEGATIVE

## 2018-03-18 MED ORDER — NAPROXEN 500 MG PO TABS: 500.0000 mg | ORAL_TABLET | Freq: Two times a day (BID) | ORAL | 0 refills | Status: DC | PRN

## 2018-03-18 MED ORDER — METRONIDAZOLE 500 MG PO TABS: 500.0000 mg | ORAL_TABLET | Freq: Two times a day (BID) | ORAL | 0 refills | Status: DC

## 2018-03-18 MED ORDER — CYCLOBENZAPRINE HCL 10 MG PO TABS: 10.0000 mg | ORAL_TABLET | Freq: Three times a day (TID) | ORAL | 0 refills | Status: DC | PRN

## 2018-03-18 NOTE — Progress Notes (Signed)
Patient ID: Jasmine Hurley, female    DOB: 08/19/1995, 22 y.o.   MRN: [id number]  PCP: Shade Flood, MD  Chief Complaint  Patient presents with  . Back Pain    pt states she has had lower back pain that starts in the pelvic area   . Vaginal Discharge    Subjective:  HPI Jasmine Hurley is a 22 y.o. female presents for evaluation of lower back pain with pelvic pain. Onset of discharge: 1 week   Vaginal discharge on and off increased Back/Pelvic/ flank pain onset: x 1 month History of bacterial vaginosis and chlaymida Complains of white discharge which has a mild odor. No recent sexual activity. Back to pelvic pain occurs with sitting and standing. Pelvic pain is aching 15 - 20 seconds. Sharp pain for about year that comes and goes. Nexplanon in place. Denies fever, chills, flank pain, nausea, and vomiting. Social History   Socioeconomic History  . Marital status: Single    Spouse name: Not on file  . Number of children: Not on file  . Years of education: Not on file  . Highest education level: Not on file  Occupational History  . Not on file  Social Needs  . Financial resource strain: Not on file  . Food insecurity:    Worry: Not on file    Inability: Not on file  . Transportation needs:    Medical: Not on file    Non-medical: Not on file  Tobacco Use  . Smoking status: Never Smoker  . Smokeless tobacco: Never Used  Substance and Sexual Activity  . Alcohol use: No  . Drug use: No  . Sexual activity: Not on file  Lifestyle  . Physical activity:    Days per week: Not on file    Minutes per session: Not on file  . Stress: Not on file  Relationships  . Social connections:    Talks on phone: Not on file    Gets together: Not on file    Attends religious service: Not on file    Active member of club or organization: Not on file    Attends meetings of clubs or organizations: Not on file    Relationship status: Not on file  . Intimate partner violence:    Fear  of current or ex partner: Not on file    Emotionally abused: Not on file    Physically abused: Not on file    Forced sexual activity: Not on file  Other Topics Concern  . Not on file  Social History Narrative  . Not on file    Family History  Problem Relation Age of Onset  . Hypertension Mother   . Hypertension Father      Review of Systems  Patient Active Problem List   Diagnosis Date Noted  . Thyromegaly 01/15/2016    No Known Allergies  Prior to Admission medications   Medication Sig Start Date End Date Taking? Authorizing Provider  etonogestrel (NEXPLANON) 68 MG IMPL implant 1 each by Subdermal route once.   Yes [provider]    Past Medical, Surgical Family and Social History reviewed and updated.    Objective:   Today's Vitals   03/18/18 1238  BP: 125/75  Pulse: 88  Resp: 16  Temp: 98 F (36.7 C)  TempSrc: Oral  SpO2: 98%  Weight: 171 lb (77.6 kg)  Height: 5\' 5"  (1.651 m)    Wt Readings from Last 3 Encounters:  03/18/18 171 lb (  77.6 kg)  09/28/17 176 lb 12.9 oz (80.2 kg)  08/23/17 175 lb (79.4 kg)   Physical Exam General appearance: alert, well developed, well nourished, cooperative and in no distress Head: Normocephalic, without obvious abnormality, atraumatic Respiratory: Respirations even and unlabored, normal respiratory rate Extremities: No gross deformities Skin: Skin color, texture, turgor normal. No rashes seen  Psych: Appropriate mood and affect. Neurologic: Mental status: Alert, oriented to person, place, and time, thought content appropriate.   Assessment & Plan:  1. Vaginal discharge, treating based on symptoms.  Will trial metronidazole 500 mg twice daily with meals x 7 days.  - POCT Wet + KOH Prep, pending - GC/Chlamydia Probe Amp, pending  - POCT urine pregnancy, negative   2. Flank pain, POCT urinalysis dipstick, unremarkable.   3. Bilateral low back pain without sciatica, unspecified chronicity,  -POCT urine  pregnancy, negative . Will trial cyclobenzaprine 10 mg up to 3 times as needed along with naproxen as needed for back pain. 4. Pelvic pain in female. Negative for pregnancy. Physical exam indicates no acute distress or abnormal findings. Recommend naproxen as needed for pain. If pain persists once vaginal discharge resolves, she may warrant a gynecological follow-up.   Meds ordered this encounter  Medications  . naproxen (NAPROSYN) 500 MG tablet    Sig: Take 1 tablet (500 mg total) by mouth 2 (two) times daily as needed (take with food).    Dispense:  30 tablet    Refill:  0  . metroNIDAZOLE (FLAGYL) 500 MG tablet    Sig: Take 1 tablet (500 mg total) by mouth 2 (two) times daily with a meal. DO NOT CONSUME ALCOHOL WHILE TAKING THIS MEDICATION.    Dispense:  14 tablet    Refill:  0  . cyclobenzaprine (FLEXERIL) 10 MG tablet    Sig: Take 1 tablet (10 mg total) by mouth 3 (three) times daily as needed for muscle spasms.    Dispense:  30 tablet    Refill:  0     If symptoms worsen or do not improve, return for follow-up, follow-up with PCP, or at the emergency department if severity of symptoms warrant a higher level of care.    Godfrey PickKimberly S. Tiburcio PeaHarris, FNP-C Nurse Practitioner (PRN Staff)  [facility] [address] [place] [address] [place], [place]  [phone]

## 2018-03-18 NOTE — Patient Instructions (Addendum)
° ° ° °  If you have lab work done today you will be contacted with your lab results within the next 2 weeks.  If you have not heard from us then please contact us. The fastest way to get your results is to register for My Chart. ° ° °IF you received an x-ray today, you will receive an invoice from [facility]. Please contact [facility] at [phone] with questions or concerns regarding your invoice.  ° °IF you received labwork today, you will receive an invoice from [facility]. Please contact [facility] at [phone] with questions or concerns regarding your invoice.  ° °Our billing staff will not be able to assist you with questions regarding bills from these companies. ° °You will be contacted with the lab results as soon as they are available. The fastest way to get your results is to activate your My Chart account. Instructions are located on the last page of this paperwork. If you have not heard from us regarding the results in 2 weeks, please contact this office. °  ° ° ° °

## 2018-03-22 LAB — GC/CHLAMYDIA PROBE AMP
Chlamydia trachomatis, NAA: NEGATIVE
NEISSERIA GONORRHOEAE BY PCR: NEGATIVE

## 2018-04-29 ENCOUNTER — Ambulatory Visit: Payer: 59 | Admitting: Family Medicine

## 2018-04-29 NOTE — Progress Notes (Deleted)
  No chief complaint on file.   HPI  4 review of systems  No past medical history on file.  Current Outpatient Medications  Medication Sig Dispense Refill  . cyclobenzaprine (FLEXERIL) 10 MG tablet Take 1 tablet (10 mg total) by mouth 3 (three) times daily as needed for muscle spasms. 30 tablet 0  . etonogestrel (NEXPLANON) 68 MG IMPL implant 1 each by Subdermal route once.    . metroNIDAZOLE (FLAGYL) 500 MG tablet Take 1 tablet (500 mg total) by mouth 2 (two) times daily with a meal. DO NOT CONSUME ALCOHOL WHILE TAKING THIS MEDICATION. 14 tablet 0  . naproxen (NAPROSYN) 500 MG tablet Take 1 tablet (500 mg total) by mouth 2 (two) times daily as needed (take with food). 30 tablet 0   No current facility-administered medications for this visit.     Allergies: No Known Allergies  No past surgical history on file.  Social History   Socioeconomic History  . Marital status: Single    Spouse name: Not on file  . Number of children: Not on file  . Years of education: Not on file  . Highest education level: Not on file  Occupational History  . Not on file  Social Needs  . Financial resource strain: Not on file  . Food insecurity:    Worry: Not on file    Inability: Not on file  . Transportation needs:    Medical: Not on file    Non-medical: Not on file  Tobacco Use  . Smoking status: Never Smoker  . Smokeless tobacco: Never Used  Substance and Sexual Activity  . Alcohol use: No  . Drug use: No  . Sexual activity: Not on file  Lifestyle  . Physical activity:    Days per week: Not on file    Minutes per session: Not on file  . Stress: Not on file  Relationships  . Social connections:    Talks on phone: Not on file    Gets together: Not on file    Attends religious service: Not on file    Active member of club or organization: Not on file    Attends meetings of clubs or organizations: Not on file    Relationship status: Not on file  Other Topics Concern  . Not on  file  Social History Narrative  . Not on file    Family History  Problem Relation Age of Onset  . Hypertension Mother   . Hypertension Father      ROS Review of Systems See HPI Constitution: No fevers or chills No malaise No diaphoresis Skin: No rash or itching Eyes: no blurry vision, no double vision GU: no dysuria or hematuria Neuro: no dizziness or headaches * all others reviewed and negative   Objective: There were no vitals filed for this visit.  Physical Exam  Assessment and Plan There are no diagnoses linked to this encounter.   [employer]

## 2018-05-01 ENCOUNTER — Encounter: Payer: Self-pay | Admitting: Family Medicine

## 2018-05-01 ENCOUNTER — Other Ambulatory Visit: Payer: Self-pay

## 2018-05-01 ENCOUNTER — Ambulatory Visit: Payer: 59 | Admitting: Family Medicine

## 2018-05-01 VITALS — BP 129/85 | HR 72 | Temp 98.9°F | Ht 65.0 in | Wt 171.8 lb

## 2018-05-01 DIAGNOSIS — K5900 Constipation, unspecified: Secondary | ICD-10-CM | POA: Diagnosis not present

## 2018-05-01 DIAGNOSIS — N898 Other specified noninflammatory disorders of vagina: Secondary | ICD-10-CM | POA: Diagnosis not present

## 2018-05-01 LAB — POCT URINALYSIS DIP (MANUAL ENTRY)
Bilirubin, UA: NEGATIVE
Blood, UA: NEGATIVE
Glucose, UA: NEGATIVE mg/dL
Leukocytes, UA: NEGATIVE
Nitrite, UA: NEGATIVE
Spec Grav, UA: 1.025 (ref 1.010–1.025)
Urobilinogen, UA: 1 E.U./dL
pH, UA: 7 (ref 5.0–8.0)

## 2018-05-01 LAB — POCT WET + KOH PREP
Trich by wet prep: ABSENT
Yeast by KOH: ABSENT
Yeast by wet prep: ABSENT

## 2018-05-01 MED ORDER — POLYETHYLENE GLYCOL 3350 17 GM/SCOOP PO POWD: 1.0000 | Freq: Once | ORAL | 0 refills | Status: AC

## 2018-05-01 NOTE — Progress Notes (Signed)
10/28/20194:42 PM  Jasmine Hurley 05/17/1996, 22 y.o. female [id number]  Chief Complaint  Patient presents with  . Vaginal Discharge    consumed medication while using Flagyl, still having the discharge previously seen for    HPI:   Patient is a 22 y.o. female  who presents today for vaginal discharge  Reports vaginal discharge, white, yellow, rubbery, sometimes clumpy, constant, has an occasional musky smell No knew exposures Having abd pain and constipation Laxative tea not really helping No nausea or vomiting No fever or chills  Seen on 03/18/18 for vaginal discharge - treated with flagyl, did not completed treatment Gc/ch neg Wet prep negative ua neg upt neg  Has nexplanon in place  Fall Risk  05/01/2018 05/01/2018 08/23/2017 08/23/2017 03/12/2017  Falls in the past year? No No No No No     Depression screen [facility] 2/9 05/01/2018 05/01/2018 08/23/2017  Decreased Interest 0 0 0  Down, Depressed, Hopeless 0 0 0  PHQ - 2 Score 0 0 0    No Known Allergies  Prior to Admission medications   Medication Sig Start Date End Date Taking? Authorizing Provider  cyclobenzaprine (FLEXERIL) 10 MG tablet Take 1 tablet (10 mg total) by mouth 3 (three) times daily as needed for muscle spasms. 03/18/18  Yes Bing Neighbors, FNP  etonogestrel (NEXPLANON) 68 MG IMPL implant 1 each by Subdermal route once.   Yes [provider]  naproxen (NAPROSYN) 500 MG tablet Take 1 tablet (500 mg total) by mouth 2 (two) times daily as needed (take with food). 03/18/18  Yes Bing Neighbors, FNP    History reviewed. No pertinent past medical history.  History reviewed. No pertinent surgical history.  Social History   Tobacco Use  . Smoking status: Never Smoker  . Smokeless tobacco: Never Used  Substance Use Topics  . Alcohol use: Yes    Family History  Problem Relation Age of Onset  . Hypertension Mother   . Hypertension Father     ROS Per hpi  OBJECTIVE:  Blood  pressure 129/85, pulse 72, temperature 98.9 F (37.2 C), temperature source Oral, height 5\' 5"  (1.651 m), weight 171 lb 12.8 oz (77.9 kg), SpO2 100 %. Body mass index is 28.59 kg/m.   Physical Exam  Constitutional: She is oriented to person, place, and time. She appears well-developed and well-nourished.  HENT:  Head: Normocephalic and atraumatic.  Mouth/Throat: Mucous membranes are normal.  Eyes: Pupils are equal, round, and reactive to light. Conjunctivae and EOM are normal. No scleral icterus.  Neck: Neck supple.  Pulmonary/Chest: Effort normal.  Genitourinary: Vagina normal. There is no rash or lesion on the right labia. There is no rash or lesion on the left labia. Cervix exhibits discharge (scant brown red blood).  Neurological: She is alert and oriented to person, place, and time.  Skin: Skin is warm and dry.  Psychiatric: She has a normal mood and affect.  Nursing note and vitals reviewed.    Results for orders placed or performed in visit on 05/01/18 (from the past 24 hour(s))  POCT urinalysis dipstick     Status: Abnormal   Collection Time: 05/01/18  4:21 PM  Result Value Ref Range   Color, UA yellow yellow   Clarity, UA clear clear   Glucose, UA negative negative mg/dL   Bilirubin, UA negative negative   Ketones, POC UA trace (5) (A) negative mg/dL   Spec Grav, UA 4.098 1.191 - 1.025   Blood, UA negative negative  pH, UA 7.0 5.0 - 8.0   Protein Ur, POC trace (A) negative mg/dL   Urobilinogen, UA 1.0 0.2 or 1.0 E.U./dL   Nitrite, UA Negative Negative   Leukocytes, UA Negative Negative  POCT Wet + KOH Prep     Status: Abnormal   Collection Time: 05/01/18  4:35 PM  Result Value Ref Range   Yeast by KOH Absent Absent   Yeast by wet prep Absent Absent   WBC by wet prep None (A) Few   Clue Cells Wet Prep HPF POC None None   Trich by wet prep Absent Absent   Bacteria Wet Prep HPF POC Many (A) Few   Epithelial Cells By [employer] (UMFC) Moderate (A) None, Few, Too  numerous to count   RBC,UR,HPF,POC None None RBC/hpf    ASSESSMENT and PLAN  1. Vaginal discharge Reassured patient physiologic discharge present.  - POCT urinalysis dipstick - POCT Wet + KOH Prep  2. Constipation, unspecified constipation type - polyethylene glycol powder (GLYCOLAX/MIRALAX) powder; Take 255 g by mouth once for 1 dose.   Return if symptoms worsen or fail to improve.    Myles Lipps, MD Primary Care at [facility] [address] [place], [place] [address] Ph.  [phone] Fax [phone]

## 2018-05-01 NOTE — Patient Instructions (Addendum)
  For constipation   Make sure you are drinking enough water daily. Make sure you are getting enough fiber in your diet - this will make you regular - you can eat high fiber foods or use metamucil as a supplement - it is really important to drink enough water when using fiber supplements.  If your stools are hard or are formed balls or you have to strain a stool softener will help - use colace 2-3 capsule a day  For gentle treatment of constipation Use Miralax 1-2 capfuls a day until your stools are soft and regular and then decrease the usage - you can use this daily  For more aggressive treatment of constipation Use 4 capfuls of Colace and 6 doses of Miralax and drink it in 2 hours - this should result in several watery stools - if it does not repeat the next day and then go to daily miralax for a week to make sure your bowels are clean and retrained to work properly  For the most aggressive treatment of constipation Use 14 capfuls of Miralax in 1 gallon of fluid (gatoraid or water work well or a combination of the two) and drink over 12h - it is ok to eat during this time and then use Miralax 1 capful daily for about 2 weeks to prevent the constipation from returning   If you have lab work done today you will be contacted with your lab results within the next 2 weeks.  If you have not heard from us then please contact us. The fastest way to get your results is to register for My Chart.   IF you received an x-ray today, you will receive an invoice from [facility]. Please contact [facility] at [phone] with questions or concerns regarding your invoice.   IF you received labwork today, you will receive an invoice from [facility]. Please contact [facility] at [phone] with questions or concerns regarding your invoice.   Our billing staff will not be able to assist you with questions regarding bills from these companies.  You will be contacted with the lab results as  soon as they are available. The fastest way to get your results is to activate your My Chart account. Instructions are located on the last page of this paperwork. If you have not heard from us regarding the results in 2 weeks, please contact this office.     

## 2018-05-30 ENCOUNTER — Ambulatory Visit: Payer: 59 | Admitting: Family Medicine

## 2018-05-30 NOTE — Progress Notes (Deleted)
  No chief complaint on file.   HPI  4 review of systems  No past medical history on file.  Current Outpatient Medications  Medication Sig Dispense Refill  . cyclobenzaprine (FLEXERIL) 10 MG tablet Take 1 tablet (10 mg total) by mouth 3 (three) times daily as needed for muscle spasms. 30 tablet 0  . etonogestrel (NEXPLANON) 68 MG IMPL implant 1 each by Subdermal route once.    . metroNIDAZOLE (FLAGYL) 500 MG tablet Take 1 tablet (500 mg total) by mouth 2 (two) times daily with a meal. DO NOT CONSUME ALCOHOL WHILE TAKING THIS MEDICATION. (Patient not taking: Reported on 05/01/2018) 14 tablet 0  . naproxen (NAPROSYN) 500 MG tablet Take 1 tablet (500 mg total) by mouth 2 (two) times daily as needed (take with food). 30 tablet 0   No current facility-administered medications for this visit.     Allergies: No Known Allergies  No past surgical history on file.  Social History   Socioeconomic History  . Marital status: Single    Spouse name: Not on file  . Number of children: Not on file  . Years of education: Not on file  . Highest education level: Not on file  Occupational History  . Not on file  Social Needs  . Financial resource strain: Not on file  . Food insecurity:    Worry: Not on file    Inability: Not on file  . Transportation needs:    Medical: Not on file    Non-medical: Not on file  Tobacco Use  . Smoking status: Never Smoker  . Smokeless tobacco: Never Used  Substance and Sexual Activity  . Alcohol use: Yes  . Drug use: No  . Sexual activity: Yes  Lifestyle  . Physical activity:    Days per week: Not on file    Minutes per session: Not on file  . Stress: Not on file  Relationships  . Social connections:    Talks on phone: Not on file    Gets together: Not on file    Attends religious service: Not on file    Active member of club or organization: Not on file    Attends meetings of clubs or organizations: Not on file    Relationship status: Not on file   Other Topics Concern  . Not on file  Social History Narrative  . Not on file    Family History  Problem Relation Age of Onset  . Hypertension Mother   . Hypertension Father      ROS Review of Systems See HPI Constitution: No fevers or chills No malaise No diaphoresis Skin: No rash or itching Eyes: no blurry vision, no double vision GU: no dysuria or hematuria Neuro: no dizziness or headaches * all others reviewed and negative   Objective: There were no vitals filed for this visit.  Physical Exam  Assessment and Plan There are no diagnoses linked to this encounter.   Mikayela Deats P [employer]

## 2018-06-29 ENCOUNTER — Encounter: Payer: Self-pay | Admitting: Family Medicine

## 2018-06-29 ENCOUNTER — Ambulatory Visit (INDEPENDENT_AMBULATORY_CARE_PROVIDER_SITE_OTHER): Payer: 59 | Admitting: Family Medicine

## 2018-06-29 ENCOUNTER — Other Ambulatory Visit: Payer: Self-pay

## 2018-06-29 VITALS — BP 131/81 | HR 89 | Temp 98.3°F | Resp 16 | Ht 65.0 in | Wt 174.6 lb

## 2018-06-29 DIAGNOSIS — N898 Other specified noninflammatory disorders of vagina: Secondary | ICD-10-CM | POA: Diagnosis not present

## 2018-06-29 DIAGNOSIS — Z3046 Encounter for surveillance of implantable subdermal contraceptive: Secondary | ICD-10-CM | POA: Diagnosis not present

## 2018-06-29 DIAGNOSIS — Z3042 Encounter for surveillance of injectable contraceptive: Secondary | ICD-10-CM | POA: Diagnosis not present

## 2018-06-29 DIAGNOSIS — R1032 Left lower quadrant pain: Secondary | ICD-10-CM | POA: Diagnosis not present

## 2018-06-29 LAB — POCT URINALYSIS DIP (MANUAL ENTRY)
BILIRUBIN UA: NEGATIVE
BILIRUBIN UA: NEGATIVE mg/dL
GLUCOSE UA: NEGATIVE mg/dL
Leukocytes, UA: NEGATIVE
Nitrite, UA: NEGATIVE
PH UA: 7 (ref 5.0–8.0)
Protein Ur, POC: NEGATIVE mg/dL
Spec Grav, UA: 1.02 (ref 1.010–1.025)
Urobilinogen, UA: 0.2 E.U./dL

## 2018-06-29 LAB — POCT WET + KOH PREP
TRICH BY WET PREP: ABSENT
YEAST BY KOH: ABSENT
YEAST BY WET PREP: ABSENT

## 2018-06-29 LAB — POCT URINE PREGNANCY: Preg Test, Ur: NEGATIVE

## 2018-06-29 MED ORDER — MEDROXYPROGESTERONE ACETATE 150 MG/ML IM SUSP
150.0000 mg | INTRAMUSCULAR | Status: DC
  Administered 2018-06-29 – 2018-12-14 (×3): 150 mg via INTRAMUSCULAR

## 2018-06-29 NOTE — Patient Instructions (Addendum)
If you have lab work done today you will be contacted with your lab results within the next 2 weeks.  If you have not heard from us then please contact us. The fastest way to get your results is to register for My Chart.   IF you received an x-ray today, you will receive an invoice from [facility]. Please contact [facility] at [phone] with questions or concerns regarding your invoice.   IF you received labwork today, you will receive an invoice from [facility]. Please contact [facility] at [phone] with questions or concerns regarding your invoice.   Our billing staff will not be able to assist you with questions regarding bills from these companies.  You will be contacted with the lab results as soon as they are available. The fastest way to get your results is to activate your My Chart account. Instructions are located on the last page of this paperwork. If you have not heard from us regarding the results in 2 weeks, please contact this office.     Tissue Adhesive Wound Care Some cuts, wounds, lacerations, and incisions can be repaired by using tissue adhesive, also called skin glue. It holds the skin together so healing can happen faster. It forms a strong bond on the skin in about 1 minute, and it reaches its full strength in about 2-3 minutes. The adhesive disappears naturally while the wound is healing. It is important to take proper care of your wound at home while it heals. Follow these instructions at home: Wound care   Showers are allowed after the first 24 hours. Do not soak the area where the tissue adhesive was placed. Do not take baths, swim, or use hot tubs. Do not use any soaps, petroleum jelly products, or ointments on the wound. Certain ointments can weaken the glue.  If a bandage (dressing) has been applied, keep it dry.  Follow instructions from your health care provider about how often to change the dressing. ? Wash your hands with soap  and water before you change your dressing. If soap and water are not available, use hand sanitizer. ? Change your dressing as told by your health care provider. ? Leave tissue adhesive in place. It will fall off on its own after 7-10 days.  Do not scratch, rub, or pick at the adhesive.  Do not place tape over the adhesive. The adhesive could come off of the wound when you pull the tape off.  Protect the wound from further injury until it is healed.  Protect the wound from sun and tanning bed exposure while it is healing and for several weeks after healing. General instructions  Take over-the-counter and prescription medicines only as told by your health care provider.  Keep all follow-up visits as told by your health care provider. This is important. Get help right away if:  Your wound reopens and is draining.  Your wound becomes red, swollen, hot, or tender.  You develop a rash after the glue is applied.  You have increasing pain in the wound.  You have a red streak going away from the wound.  You have pus coming from the wound.  You have increased bleeding.  You have a fever.  You have shaking chills.  You notice a bad smell coming from the wound.  Your wound or the adhesive breaks open. This information is not intended to replace advice given to you by your health care provider. Make sure you discuss any questions you  have with your health care provider. Document Released: 12/15/2000 Document Revised: 05/14/2016 Document Reviewed: 05/14/2016 Elsevier Interactive Patient Education  2019 [employer]

## 2018-06-29 NOTE — Progress Notes (Signed)
Established Patient Office Visit  Subjective:  Patient ID: Jasmine Hurley, female    DOB: May 27, 1996  Age: 22 y.o. MRN: [id number]  CC:  Chief Complaint  Patient presents with  . nexplanon removal  . abdominal pain lower left quad x 2 weeks, pain is intermitte    HPI Jasmine Glatteronya L Petrov presents for nexplanon removal and LLQ pain  She states that she has been having LLQ abdominal pain  She feels a sharp pain and occasionally a dull pain She states that the pian lasts for 5-15 seconds She reports that she feels it if she has to urinate or after finishing her urination  Her urine stream seems weak and she does not feel like she has completely emptied her bladder She states that a weak stream is not normal.  She considers that her bladder still feels full.  She would like to switch from nexplanon to Depo She did not like nexplanon because of irregular periods, weight gain, acne vulgaris and mood swings She did not have these problems with Depo which she was on prior to Dec 2016 when her nexplanon was placed   No past medical history on file.  No past surgical history on file.  Family History  Problem Relation Age of Onset  . Hypertension Mother   . Hypertension Father     Social History   Socioeconomic History  . Marital status: Single    Spouse name: Not on file  . Number of children: Not on file  . Years of education: Not on file  . Highest education level: Not on file  Occupational History  . Not on file  Social Needs  . Financial resource strain: Not on file  . Food insecurity:    Worry: Not on file    Inability: Not on file  . Transportation needs:    Medical: Not on file    Non-medical: Not on file  Tobacco Use  . Smoking status: Never Smoker  . Smokeless tobacco: Never Used  Substance and Sexual Activity  . Alcohol use: Yes  . Drug use: No  . Sexual activity: Yes  Lifestyle  . Physical activity:    Days per week: Not on file    Minutes per session:  Not on file  . Stress: Not on file  Relationships  . Social connections:    Talks on phone: Not on file    Gets together: Not on file    Attends religious service: Not on file    Active member of club or organization: Not on file    Attends meetings of clubs or organizations: Not on file    Relationship status: Not on file  . Intimate partner violence:    Fear of current or ex partner: Not on file    Emotionally abused: Not on file    Physically abused: Not on file    Forced sexual activity: Not on file  Other Topics Concern  . Not on file  Social History Narrative  . Not on file    Outpatient Medications Prior to Visit  Medication Sig Dispense Refill  . etonogestrel (NEXPLANON) 68 MG IMPL implant 1 each by Subdermal route once.    . naproxen (NAPROSYN) 500 MG tablet Take 1 tablet (500 mg total) by mouth 2 (two) times daily as needed (take with food). 30 tablet 0  . cyclobenzaprine (FLEXERIL) 10 MG tablet Take 1 tablet (10 mg total) by mouth 3 (three) times daily as needed for muscle spasms. (  Patient not taking: Reported on 06/29/2018) 30 tablet 0  . metroNIDAZOLE (FLAGYL) 500 MG tablet Take 1 tablet (500 mg total) by mouth 2 (two) times daily with a meal. DO NOT CONSUME ALCOHOL WHILE TAKING THIS MEDICATION. (Patient not taking: Reported on 05/01/2018) 14 tablet 0   No facility-administered medications prior to visit.     No Known Allergies  ROS Review of Systems Review of Systems  Constitutional: Negative for activity change, appetite change, chills and fever.  HENT: Negative for congestion, nosebleeds, trouble swallowing and voice change.   Respiratory: Negative for cough, shortness of breath and wheezing.   Gastrointestinal: Negative for diarrhea, nausea and vomiting.  Genitourinary: see hpi Musculoskeletal: Negative for back pain, joint swelling and neck pain.  Neurological: Negative for dizziness, speech difficulty, light-headedness and numbness.  See HPI. All other  review of systems negative.     Objective:  BP 131/81 (BP Location: Right Arm, Patient Position: Sitting, Cuff Size: Normal)   Pulse 89   Temp 98.3 F (36.8 C) (Oral)   Resp 16   Ht 5\' 5"  (1.651 m)   Wt 174 lb 9.6 oz (79.2 kg)   SpO2 96%   BMI 29.05 kg/m  Wt Readings from Last 3 Encounters:  06/29/18 174 lb 9.6 oz (79.2 kg)  05/01/18 171 lb 12.8 oz (77.9 kg)  03/18/18 171 lb (77.6 kg)    Physical Exam  Physical Exam  Constitutional: She is oriented to person, place, and time. She appears well-developed and well-nourished.  HENT:  Head: Normocephalic and atraumatic.  Eyes: Conjunctivae and EOM are normal.  Cardiovascular: Normal rate, regular rhythm and normal heart sounds.   Pulmonary/Chest: Effort normal and breath sounds normal. No respiratory distress. She has no wheezes.  Abdominal: Normal appearance and bowel sounds are normal. There is no tenderness. There is no CVA tenderness.  Neurological: She is alert and oriented to person, place, and time.     There are no preventive care reminders to display for this patient.  There are no preventive care reminders to display for this patient.  Lab Results  Component Value Date   TSH 1.97 01/15/2016   Lab Results  Component Value Date   WBC 6.5 09/20/2016   HGB 12.1 (A) 09/20/2016   HCT 35.0 (A) 09/20/2016   MCV 90.3 09/20/2016   PLT 323 02/20/2016   Lab Results  Component Value Date   NA 137 09/20/2016   K 4.3 09/20/2016   CO2 21 09/20/2016   GLUCOSE 142 (H) 09/20/2016   BUN 7 09/20/2016   CREATININE 0.70 09/20/2016   BILITOT <0.2 09/20/2016   ALKPHOS 62 09/20/2016   AST 17 09/20/2016   ALT 16 09/20/2016   PROT 7.0 09/20/2016   ALBUMIN 4.2 09/20/2016   CALCIUM 8.9 09/20/2016   No results found for: CHOL No results found for: HDL No results found for: LDLCALC No results found for: TRIG No results found for: CHOLHDL Lab Results  Component Value Date   HGBA1C 5.6 09/20/2016      Assessment &  Plan:   Problem List Items Addressed This Visit    None    Visit Diagnoses    Abdominal pain, left lower quadrant    -  Primary  Advised pt that her UA is negative but will send culture    Relevant Orders   POCT urinalysis dipstick (Completed)   GC/Chlamydia Probe Amp   Depot contraception     Started Depo today    Relevant Medications  medroxyPROGESTERone (DEPO-PROVERA) injection 150 mg   Other Relevant Orders   POCT urine pregnancy (Completed)   Vaginal discharge     Discussed her screenings for gonorrhea and chlamydia Wet prep negative for BV or trich   Relevant Orders   POCT Wet + KOH Prep       Nexplanon REMOVAL procedure NOTE  Explained the procedure. Verbal consent given. Verified location of nexplanon in the left groove of the biceps. Cleaned area with betadine. Using lidocaine with epinephrine for local anesthesia the skin over the distal tip of the nexplanon was cut open but due to edema the nexplanon was not able to be pulled through.  Cleaned and anesthesized the skin over the proximal tip of the nexplanon. Using blunt dissection the fascia was removed and the nexplanon pulled using tweezers. Skin closed with dermabond and wrapped with ace wrap.   Depo provera given today  Advised pt to return in 3 months for follow up     Meds ordered this encounter  Medications  . medroxyPROGESTERone (DEPO-PROVERA) injection 150 mg    Follow-up: Return in about 3 months (around 09/28/2018) for Depo.    Doristine Bosworth, MD

## 2018-07-01 ENCOUNTER — Telehealth: Payer: Self-pay | Admitting: Family Medicine

## 2018-07-01 LAB — URINE CULTURE

## 2018-07-01 LAB — GC/CHLAMYDIA PROBE AMP
Chlamydia trachomatis, NAA: NEGATIVE
Neisseria gonorrhoeae by PCR: NEGATIVE

## 2018-07-01 MED ORDER — AMOXICILLIN 500 MG PO CAPS: 500.0000 mg | ORAL_CAPSULE | Freq: Two times a day (BID) | ORAL | 0 refills | Status: AC

## 2018-07-01 NOTE — Telephone Encounter (Signed)
Left voicemail to check her mychart or call back for information about her labs She has GBS bacteria. Will treat today

## 2018-07-19 ENCOUNTER — Encounter: Payer: Self-pay | Admitting: Family Medicine

## 2018-07-19 ENCOUNTER — Other Ambulatory Visit: Payer: Self-pay

## 2018-07-19 ENCOUNTER — Ambulatory Visit: Payer: 59 | Admitting: Family Medicine

## 2018-07-19 VITALS — BP 117/77 | HR 98 | Temp 98.4°F | Resp 16 | Ht 65.0 in | Wt 172.0 lb

## 2018-07-19 DIAGNOSIS — J069 Acute upper respiratory infection, unspecified: Secondary | ICD-10-CM

## 2018-07-19 DIAGNOSIS — J029 Acute pharyngitis, unspecified: Secondary | ICD-10-CM

## 2018-07-19 LAB — POCT RAPID STREP A (OFFICE): RAPID STREP A SCREEN: NEGATIVE

## 2018-07-19 NOTE — Patient Instructions (Addendum)
Saline nasal spray atleast 4 times per day, over the counter mucinex or mucinex DM or tessalon perles if needed for cough. cepacol or other lozenge for sore throat. Drink plenty of fluids.   Return to the clinic or go to the nearest emergency room if any of your symptoms worsen or new symptoms occur.  Sore Throat A sore throat is pain, burning, irritation, or scratchiness in the throat. When you have a sore throat, you may feel pain or tenderness in your throat when you swallow or talk. Many things can cause a sore throat, including:  An infection.  Seasonal allergies.  Dryness in the air.  Irritants, such as smoke or pollution.  Radiation treatment to the area.  Gastroesophageal reflux disease (GERD).  A tumor. A sore throat is often the first sign of another sickness. It may happen with other symptoms, such as coughing, sneezing, fever, and swollen neck glands. Most sore throats go away without medical treatment. Follow these instructions at home:      Take over-the-counter medicines only as told by your health care provider. ? If your child has a sore throat, do not give your child aspirin because of the association with Reye syndrome.  Drink enough fluids to keep your urine pale yellow.  Rest as needed.  To help with pain, try: ? Sipping warm liquids, such as broth, herbal tea, or warm water. ? Eating or drinking cold or frozen liquids, such as frozen ice pops. ? Gargling with a salt-water mixture 3-4 times a day or as needed. To make a salt-water mixture, completely dissolve -1 tsp (3-6 g) of salt in 1 cup (237 mL) of warm water. ? Sucking on hard candy or throat lozenges. ? Putting a cool-mist humidifier in your bedroom at night to moisten the air. ? Sitting in the bathroom with the door closed for 5-10 minutes while you run hot water in the shower.  Do not use any products that contain nicotine or tobacco, such as cigarettes, e-cigarettes, and chewing tobacco. If  you need help quitting, ask your health care provider.  Wash your hands well and often with soap and water. If soap and water are not available, use hand sanitizer. Contact a health care provider if:  You have a fever for more than 2-3 days.  You have symptoms that last (are persistent) for more than 2-3 days.  Your throat does not get better within 7 days.  You have a fever and your symptoms suddenly get worse.  Your child who is 3 months to 81 years old has a temperature of 102.18F (39C) or higher. Get help right away if:  You have difficulty breathing.  You cannot swallow fluids, soft foods, or your saliva.  You have increased swelling in your throat or neck.  You have persistent nausea and vomiting. Summary  A sore throat is pain, burning, irritation, or scratchiness in the throat. Many things can cause a sore throat.  Take over-the-counter medicines only as told by your health care provider. Do not give your child aspirin.  Drink plenty of fluids, and rest as needed.  Contact a health care provider if your symptoms worsen or your sore throat does not get better within 7 days. This information is not intended to replace advice given to you by your health care provider. Make sure you discuss any questions you have with your health care provider. Document Released: 07/29/2004 Document Revised: 11/21/2017 Document Reviewed: 11/21/2017 Elsevier Interactive Patient Education  2019 [employer].  Upper Respiratory Infection, Adult An upper respiratory infection (URI) affects the nose, throat, and upper air passages. URIs are caused by germs (viruses). The most common type of URI is often called "the common cold." Medicines cannot cure URIs, but you can do things at home to relieve your symptoms. URIs usually get better within 7-10 days. Follow these instructions at home: Activity  Rest as needed.  If you have a fever, stay home from work or school until your fever is  gone, or until your doctor says you may return to work or school. ? You should stay home until you cannot spread the infection anymore (you are not contagious). ? Your doctor may have you wear a face mask so you have less risk of spreading the infection. Relieving symptoms  Gargle with a salt-water mixture 3-4 times a day or as needed. To make a salt-water mixture, completely dissolve -1 tsp of salt in 1 cup of warm water.  Use a cool-mist humidifier to add moisture to the air. This can help you breathe more easily. Eating and drinking   Drink enough fluid to keep your pee (urine) pale yellow.  Eat soups and other clear broths. General instructions   Take over-the-counter and prescription medicines only as told by your doctor. These include cold medicines, fever reducers, and cough suppressants.  Do not use any products that contain nicotine or tobacco. These include cigarettes and e-cigarettes. If you need help quitting, ask your doctor.  Avoid being where people are smoking (avoid secondhand smoke).  Make sure you get regular shots and get the flu shot every year.  Keep all follow-up visits as told by your doctor. This is important. How to avoid spreading infection to others   Wash your hands often with soap and water. If you do not have soap and water, use hand sanitizer.  Avoid touching your mouth, face, eyes, or nose.  Cough or sneeze into a tissue or your sleeve or elbow. Do not cough or sneeze into your hand or into the air. Contact a doctor if:  You are getting worse, not better.  You have any of these: ? A fever. ? Chills. ? Brown or red mucus in your nose. ? Yellow or brown fluid (discharge)coming from your nose. ? Pain in your face, especially when you bend forward. ? Swollen neck glands. ? Pain with swallowing. ? White areas in the back of your throat. Get help right away if:  You have shortness of breath that gets worse.  You have very bad or  constant: ? Headache. ? Ear pain. ? Pain in your forehead, behind your eyes, and over your cheekbones (sinus pain). ? Chest pain.  You have long-lasting (chronic) lung disease along with any of these: ? Wheezing. ? Long-lasting cough. ? Coughing up blood. ? A change in your usual mucus.  You have a stiff neck.  You have changes in your: ? Vision. ? Hearing. ? Thinking. ? Mood. Summary  An upper respiratory infection (URI) is caused by a germ called a virus. The most common type of URI is often called "the common cold."  URIs usually get better within 7-10 days.  Take over-the-counter and prescription medicines only as told by your doctor. This information is not intended to replace advice given to you by your health care provider. Make sure you discuss any questions you have with your health care provider. Document Released: 12/08/2007 Document Revised: 02/11/2017 Document Reviewed: 02/11/2017 Elsevier Interactive Patient Education  2019 Elsevier  Inc.   If you have lab work done today you will be contacted with your lab results within the next 2 weeks.  If you have not heard from us then please contact us. The fastest way to get your results is to register for My Chart.   IF you received an x-ray today, you will receive an invoice from [facility]. Please contact [facility] at [phone] with questions or concerns regarding your invoice.   IF you received labwork today, you will receive an invoice from [facility]. Please contact [facility] at [phone] with questions or concerns regarding your invoice.   Our billing staff will not be able to assist you with questions regarding bills from these companies.  You will be contacted with the lab results as soon as they are available. The fastest way to get your results is to activate your My Chart account. Instructions are located on the last page of this paperwork. If you have not heard from us regarding the  results in 2 weeks, please contact this office.

## 2018-07-19 NOTE — Progress Notes (Signed)
By signing my name below, I,Jasmine Hurley, attest that this documentation has been prepared under the direction and in the presence of Shade Flood, MD. Electronically Signed: Robbi Garter, Scribe 07/19/2018 at 9:57 AM.  Subjective:  Patient ID: Jasmine Hurley, female    DOB: [birth year]  Age: 23 y.o. MRN: [id number]  CC:  Chief Complaint  Patient presents with  . Sore Throat    with some dry cough x 4 days   . Nasal Congestion    with drainage     HPI Jasmine Hurley is a 23 y.o. female that presents with complaints or sore throat, dry cough (x4 days), and nasal congestion  Her symptoms have persisted for 5 days. At that time (on Saturday/Sunday), she was experiencing sneezing, coughing, sore throat and mild nasal congestion. On Monday, her symptoms worsened and she reports experiencing rhinorrhea, nasal congestion, coughing, sneezing and sore throat since. She reports sore throat as her most bothersome symptom.   She noted a sore back on Monday but nothing since then. She denies fever, chills, and body aches.   She reports multiple positive sick contacts, and has attempted to self treat with Nyquil and nothing else.  History Patient Active Problem List   Diagnosis Date Noted  . Thyromegaly 01/15/2016   History reviewed. No pertinent past medical history. History reviewed. No pertinent surgical history. No Known Allergies Prior to Admission medications   Not on File   Family History  Problem Relation Age of Onset  . Hypertension Mother   . Hypertension Father    Social History   Socioeconomic History  . Marital status: Single    Spouse name: Not on file  . Number of children: Not on file  . Years of education: Not on file  . Highest education level: Not on file  Occupational History  . Not on file  Social Needs  . Financial resource strain: Not on file  . Food insecurity:    Worry: Not on file    Inability: Not on file  .  Transportation needs:    Medical: Not on file    Non-medical: Not on file  Tobacco Use  . Smoking status: Never Smoker  . Smokeless tobacco: Never Used  Substance and Sexual Activity  . Alcohol use: Yes  . Drug use: No  . Sexual activity: Yes  Lifestyle  . Physical activity:    Days per week: Not on file    Minutes per session: Not on file  . Stress: Not on file  Relationships  . Social connections:    Talks on phone: Not on file    Gets together: Not on file    Attends religious service: Not on file    Active member of club or organization: Not on file    Attends meetings of clubs or organizations: Not on file    Relationship status: Not on file  . Intimate partner violence:    Fear of current or ex partner: Not on file    Emotionally abused: Not on file    Physically abused: Not on file    Forced sexual activity: Not on file  Other Topics Concern  . Not on file  Social History Narrative  . Not on file    Review of Systems  Constitutional: Positive for fatigue. Negative for chills and fever.  HENT: Positive for congestion, rhinorrhea, sneezing and sore throat. Negative for sinus pressure, sinus pain and trouble swallowing.   Respiratory: Positive for cough. Negative for  chest tightness, shortness of breath and wheezing.   Gastrointestinal: Negative for diarrhea, nausea and vomiting.  Musculoskeletal: Positive for back pain (on Monday. nothing since).  All other systems reviewed and are negative.   Objective:  BP 117/77   Pulse 98   Temp 98.4 F (36.9 C) (Oral)   Resp 16   Ht 5\' 5"  (1.651 m)   Wt 172 lb (78 kg)   SpO2 98%   BMI 28.62 kg/m   Physical Exam Constitutional:      General: She is not in acute distress.    Appearance: She is well-developed.  HENT:     Head: Normocephalic and atraumatic.     Right Ear: Tympanic membrane normal.     Left Ear: Tympanic membrane normal.     Nose:     Right Sinus: No maxillary sinus tenderness or frontal sinus  tenderness.     Left Sinus: No maxillary sinus tenderness or frontal sinus tenderness.     Mouth/Throat:     Pharynx: Posterior oropharyngeal erythema (very minimal on the right) present. No oropharyngeal exudate.     Tonsils: No tonsillar exudate.  Cardiovascular:     Rate and Rhythm: Normal rate and regular rhythm.     Heart sounds: Normal heart sounds. No murmur. No friction rub. No gallop.   Pulmonary:     Effort: Pulmonary effort is normal. No respiratory distress.     Breath sounds: Normal breath sounds. No wheezing.  Neurological:     Mental Status: She is alert and oriented to person, place, and time.  Psychiatric:        Mood and Affect: Mood normal.        Behavior: Behavior normal.    Results for orders placed or performed in visit on 07/19/18  POCT rapid strep A  Result Value Ref Range   Rapid Strep A Screen Negative Negative    Assessment & Plan:    Jasmine Glatteronya L Cerullo is a 23 y.o. female Acute upper respiratory infection  Sore throat - Plan: POCT rapid strep A  -Suspected viral syndrome.  Symptomatic care discussed with saline nasal spray, Mucinex DM, fluids, Cepacol cough drops, RTC precautions given, handout given  No orders of the defined types were placed in this encounter.  Patient Instructions     Saline nasal spray atleast 4 times per day, over the counter mucinex or mucinex DM or tessalon perles if needed for cough. cepacol or other lozenge for sore throat. Drink plenty of fluids.   Return to the clinic or go to the nearest emergency room if any of your symptoms worsen or new symptoms occur.  Sore Throat A sore throat is pain, burning, irritation, or scratchiness in the throat. When you have a sore throat, you may feel pain or tenderness in your throat when you swallow or talk. Many things can cause a sore throat, including:  An infection.  Seasonal allergies.  Dryness in the air.  Irritants, such as smoke or pollution.  Radiation treatment to  the area.  Gastroesophageal reflux disease (GERD).  A tumor. A sore throat is often the first sign of another sickness. It may happen with other symptoms, such as coughing, sneezing, fever, and swollen neck glands. Most sore throats go away without medical treatment. Follow these instructions at home:      Take over-the-counter medicines only as told by your health care provider. ? If your child has a sore throat, do not give your child aspirin because of the  association with Reye syndrome.  Drink enough fluids to keep your urine pale yellow.  Rest as needed.  To help with pain, try: ? Sipping warm liquids, such as broth, herbal tea, or warm water. ? Eating or drinking cold or frozen liquids, such as frozen ice pops. ? Gargling with a salt-water mixture 3-4 times a day or as needed. To make a salt-water mixture, completely dissolve -1 tsp (3-6 g) of salt in 1 cup (237 mL) of warm water. ? Sucking on hard candy or throat lozenges. ? Putting a cool-mist humidifier in your bedroom at night to moisten the air. ? Sitting in the bathroom with the door closed for 5-10 minutes while you run hot water in the shower.  Do not use any products that contain nicotine or tobacco, such as cigarettes, e-cigarettes, and chewing tobacco. If you need help quitting, ask your health care provider.  Wash your hands well and often with soap and water. If soap and water are not available, use hand sanitizer. Contact a health care provider if:  You have a fever for more than 2-3 days.  You have symptoms that last (are persistent) for more than 2-3 days.  Your throat does not get better within 7 days.  You have a fever and your symptoms suddenly get worse.  Your child who is 3 months to 15 years old has a temperature of 102.51F (39C) or higher. Get help right away if:  You have difficulty breathing.  You cannot swallow fluids, soft foods, or your saliva.  You have increased swelling in your  throat or neck.  You have persistent nausea and vomiting. Summary  A sore throat is pain, burning, irritation, or scratchiness in the throat. Many things can cause a sore throat.  Take over-the-counter medicines only as told by your health care provider. Do not give your child aspirin.  Drink plenty of fluids, and rest as needed.  Contact a health care provider if your symptoms worsen or your sore throat does not get better within 7 days. This information is not intended to replace advice given to you by your health care provider. Make sure you discuss any questions you have with your health care provider. Document Released: 07/29/2004 Document Revised: 11/21/2017 Document Reviewed: 11/21/2017 Elsevier Interactive Patient Education  2019 Elsevier Inc.   Upper Respiratory Infection, Adult An upper respiratory infection (URI) affects the nose, throat, and upper air passages. URIs are caused by germs (viruses). The most common type of URI is often called "the common cold." Medicines cannot cure URIs, but you can do things at home to relieve your symptoms. URIs usually get better within 7-10 days. Follow these instructions at home: Activity  Rest as needed.  If you have a fever, stay home from work or school until your fever is gone, or until your doctor says you may return to work or school. ? You should stay home until you cannot spread the infection anymore (you are not contagious). ? Your doctor may have you wear a face mask so you have less risk of spreading the infection. Relieving symptoms  Gargle with a salt-water mixture 3-4 times a day or as needed. To make a salt-water mixture, completely dissolve -1 tsp of salt in 1 cup of warm water.  Use a cool-mist humidifier to add moisture to the air. This can help you breathe more easily. Eating and drinking   Drink enough fluid to keep your pee (urine) pale yellow.  Eat soups and other clear  broths. General  instructions   Take over-the-counter and prescription medicines only as told by your doctor. These include cold medicines, fever reducers, and cough suppressants.  Do not use any products that contain nicotine or tobacco. These include cigarettes and e-cigarettes. If you need help quitting, ask your doctor.  Avoid being where people are smoking (avoid secondhand smoke).  Make sure you get regular shots and get the flu shot every year.  Keep all follow-up visits as told by your doctor. This is important. How to avoid spreading infection to others   Wash your hands often with soap and water. If you do not have soap and water, use hand sanitizer.  Avoid touching your mouth, face, eyes, or nose.  Cough or sneeze into a tissue or your sleeve or elbow. Do not cough or sneeze into your hand or into the air. Contact a doctor if:  You are getting worse, not better.  You have any of these: ? A fever. ? Chills. ? Brown or red mucus in your nose. ? Yellow or brown fluid (discharge)coming from your nose. ? Pain in your face, especially when you bend forward. ? Swollen neck glands. ? Pain with swallowing. ? White areas in the back of your throat. Get help right away if:  You have shortness of breath that gets worse.  You have very bad or constant: ? Headache. ? Ear pain. ? Pain in your forehead, behind your eyes, and over your cheekbones (sinus pain). ? Chest pain.  You have long-lasting (chronic) lung disease along with any of these: ? Wheezing. ? Long-lasting cough. ? Coughing up blood. ? A change in your usual mucus.  You have a stiff neck.  You have changes in your: ? Vision. ? Hearing. ? Thinking. ? Mood. Summary  An upper respiratory infection (URI) is caused by a germ called a virus. The most common type of URI is often called "the common cold."  URIs usually get better within 7-10 days.  Take over-the-counter and prescription medicines only as told by your  doctor. This information is not intended to replace advice given to you by your health care provider. Make sure you discuss any questions you have with your health care provider. Document Released: 12/08/2007 Document Revised: 02/11/2017 Document Reviewed: 02/11/2017 Elsevier Interactive Patient Education  [employer]2019 Elsevier Inc.   If you have lab work done today you will be contacted with your lab results within the next 2 weeks.  If you have not heard from us then please contact us. The fastest way to get your results is to register for My Chart.   IF you received an x-ray today, you will receive an invoice from [facility]. Please contact [facility] at [phone] with questions or concerns regarding your invoice.   IF you received labwork today, you will receive an invoice from [facility]. Please contact [facility] at [phone] with questions or concerns regarding your invoice.   Our billing staff will not be able to assist you with questions regarding bills from these companies.  You will be contacted with the lab results as soon as they are available. The fastest way to get your results is to activate your My Chart account. Instructions are located on the last page of this paperwork. If you have not heard from us regarding the results in 2 weeks, please contact this office.       I personally performed the services described in this documentation, which was scribed in my presence. The recorded information  has been reviewed and considered for accuracy and completeness, addended by me as needed, and agree with information above.  Signed,   Meredith Staggers, MD [facility].  07/23/18 8:31 AM

## 2018-08-17 ENCOUNTER — Ambulatory Visit (INDEPENDENT_AMBULATORY_CARE_PROVIDER_SITE_OTHER): Payer: 59 | Admitting: Family Medicine

## 2018-08-17 DIAGNOSIS — Z23 Encounter for immunization: Secondary | ICD-10-CM

## 2018-08-17 NOTE — Patient Instructions (Signed)
Tolerated injection well. 

## 2018-08-31 ENCOUNTER — Telehealth: Payer: Self-pay | Admitting: Family Medicine

## 2018-08-31 NOTE — Telephone Encounter (Signed)
Copied from CRM [id number]. Topic: Quick Communication - See Telephone Encounter >> Aug 31, 2018  5:23 PM Jens Som A wrote: CRM for notification. See Telephone encounter for: 08/31/18.  Patient is calling for a copy of her immunization records. She will pick up tomorrow 09/01/18

## 2018-09-05 NOTE — Telephone Encounter (Signed)
Pt called to follow up on picking up her immunization records. Please advise once ready for pickup. CB# [phone]

## 2018-09-05 NOTE — Telephone Encounter (Signed)
Printed and at front desk for pick up. Pt is aware.

## 2018-09-06 ENCOUNTER — Ambulatory Visit (INDEPENDENT_AMBULATORY_CARE_PROVIDER_SITE_OTHER): Payer: 59 | Admitting: Family Medicine

## 2018-09-06 DIAGNOSIS — Z23 Encounter for immunization: Secondary | ICD-10-CM

## 2018-09-06 DIAGNOSIS — Z3042 Encounter for surveillance of injectable contraceptive: Secondary | ICD-10-CM

## 2018-09-06 NOTE — Telephone Encounter (Signed)
Pt picked up.

## 2018-09-06 NOTE — Progress Notes (Signed)
Vaccine visit only

## 2018-09-19 ENCOUNTER — Ambulatory Visit (INDEPENDENT_AMBULATORY_CARE_PROVIDER_SITE_OTHER): Payer: 59 | Admitting: Family Medicine

## 2018-09-19 ENCOUNTER — Other Ambulatory Visit: Payer: Self-pay

## 2018-09-19 DIAGNOSIS — Z3042 Encounter for surveillance of injectable contraceptive: Secondary | ICD-10-CM

## 2018-09-19 NOTE — Patient Instructions (Signed)
Gave injection in the left gluteal, pt tolerated well.

## 2018-09-21 ENCOUNTER — Ambulatory Visit: Payer: 59 | Admitting: Family Medicine

## 2018-10-04 ENCOUNTER — Telehealth: Payer: Self-pay | Admitting: Family Medicine

## 2018-10-04 NOTE — Telephone Encounter (Signed)
Copied from CRM [id number]. Topic: General - Other >> Oct 04, 2018  4:45 PM Tamela Oddi wrote: Reason for CRM: Patient called to schedule an appt. With the doctor for the pain in her back.  She wanted to know how soon she could get an appt.  Please advise and call patient back at [phone]

## 2018-10-04 NOTE — Telephone Encounter (Signed)
Please schedule OV for back pain

## 2018-10-11 ENCOUNTER — Telehealth (INDEPENDENT_AMBULATORY_CARE_PROVIDER_SITE_OTHER): Payer: 59 | Admitting: Family Medicine

## 2018-10-11 ENCOUNTER — Other Ambulatory Visit: Payer: Self-pay

## 2018-10-11 DIAGNOSIS — R0781 Pleurodynia: Secondary | ICD-10-CM

## 2018-10-11 DIAGNOSIS — K5900 Constipation, unspecified: Secondary | ICD-10-CM

## 2018-10-11 DIAGNOSIS — R109 Unspecified abdominal pain: Secondary | ICD-10-CM | POA: Diagnosis not present

## 2018-10-11 DIAGNOSIS — M545 Low back pain, unspecified: Secondary | ICD-10-CM

## 2018-10-11 NOTE — Progress Notes (Signed)
Spoke with pt  and her CC this morning is she has been having lower  abdominal/back pain with some rib pain x 1 month. Pt states she has had some Nausea. The pain is becoming intense.

## 2018-10-11 NOTE — Progress Notes (Signed)
Virtual Visit via Video Note  I connected with Jasmine Hurley on 10/11/18 at 8:46 AM by phone  video enabled telemedicine application declined) verified that I am speaking with the correct person using two identifiers.   I discussed the limitations, risks, security and privacy concerns of performing an evaluation and management service by telephone and the availability of in person appointments. I also discussed with the patient that there may be a patient responsible charge related to this service. The patient expressed understanding and agreed to proceed, consent obtained  Chief complaint: Abdominal pain, back pain, rib pain.  History of Present Illness: Jasmine Hurley is a 23 y.o. female  Since beginning of march - lower back pain and lower stomach pain. Thought similar to UTI symptoms. No dysuria/frequency/urgency or hematuria. Vaginal itching for 3 days - 2 weeks ago. None now.  Pain under left breast/rib area in past week.  No fever.  Occasional nausea with abd/back pain, but less now. No vomiting.  abd pain and back pain have improved. Now more of rib pain.  Min cough, not new. Sore to breath out at times, not constant rib pain. More at night. No known injury.  [id number] spotting, utd on depo provera.  Last BM: last night. Hard stool - small pellets. incomplete emptying feeling with BM's, usually every other day, but some hard stools.  Not recently sexually active - last active few months ago, not active since last STI screening.   Depo-Provera for contraception, last given March 17.  Switched from [place] in December 2019.   No past medical history on file. No past surgical history on file. No Known Allergies Prior to Admission medications   Not on File   Social History   Socioeconomic History  . Marital status: Single    Spouse name: Not on file  . Number of children: Not on file  . Years of education: Not on file  . Highest education level: Not on file   Occupational History  . Not on file  Social Needs  . Financial resource strain: Not on file  . Food insecurity:    Worry: Not on file    Inability: Not on file  . Transportation needs:    Medical: Not on file    Non-medical: Not on file  Tobacco Use  . Smoking status: Never Smoker  . Smokeless tobacco: Never Used  Substance and Sexual Activity  . Alcohol use: Yes  . Drug use: No  . Sexual activity: Yes  Lifestyle  . Physical activity:    Days per week: Not on file    Minutes per session: Not on file  . Stress: Not on file  Relationships  . Social connections:    Talks on phone: Not on file    Gets together: Not on file    Attends religious service: Not on file    Active member of club or organization: Not on file    Attends meetings of clubs or organizations: Not on file    Relationship status: Not on file  . Intimate partner violence:    Fear of current or ex partner: Not on file    Emotionally abused: Not on file    Physically abused: Not on file    Forced sexual activity: Not on file  Other Topics Concern  . Not on file  Social History Narrative  . Not on file    Observations/Objective: No distress on phone.  Speaking normally, no respiratory distress  Assessment and  Plan: Abdominal pain, unspecified abdominal location  Low back pain without sciatica, unspecified back pain laterality, unspecified chronicity  Rib pain on left side  Constipation, unspecified constipation type  Migratory discomfort as above with some improvement in back and abdominal pain, now more under the rib.  No concerning other symptoms on history.  Does have some symptoms of hard stool/likely constipation which may be contributing to some of the symptoms.  Initial trial of MiraLAX/bowel regimen then recheck in a few days with repeat visit if not showing some improvement.  Sooner if worse/ER precautions given  Follow Up Instructions:  2 days by telemed or in office if needed.      Patient Instructions  Based on her symptoms I think it is reasonable to try MiraLAX initially to see if some of your symptoms could be from constipation.  If not improving in the next few days, we can do other testing including urine testing but that would be less likely without urinary symptoms.  We will set you up for a follow-up visit, but if any worsening symptoms sooner let us know.  Return to the clinic or go to the nearest emergency room if any of your symptoms worsen or new symptoms occur.   Constipation, Adult Constipation is when a person has fewer bowel movements in a week than normal, has difficulty having a bowel movement, or has stools that are dry, hard, or larger than normal. Constipation may be caused by an underlying condition. It may become worse with age if a person takes certain medicines and does not take in enough fluids. Follow these instructions at home: Eating and drinking   Eat foods that have a lot of fiber, such as fresh fruits and vegetables, whole grains, and beans.  Limit foods that are high in fat, low in fiber, or overly processed, such as french fries, hamburgers, cookies, candies, and soda.  Drink enough fluid to keep your urine clear or pale yellow. General instructions  Exercise regularly or as told by your health care provider.  Go to the restroom when you have the urge to go. Do not hold it in.  Take over-the-counter and prescription medicines only as told by your health care provider. These include any fiber supplements.  Practice pelvic floor retraining exercises, such as deep breathing while relaxing the lower abdomen and pelvic floor relaxation during bowel movements.  Watch your condition for any changes.  Keep all follow-up visits as told by your health care provider. This is important. Contact a health care provider if:  You have pain that gets worse.  You have a fever.  You do not have a bowel movement after 4 days.  You vomit.   You are not hungry.  You lose weight.  You are bleeding from the anus.  You have thin, pencil-like stools. Get help right away if:  You have a fever and your symptoms suddenly get worse.  You leak stool or have blood in your stool.  Your abdomen is bloated.  You have severe pain in your abdomen.  You feel dizzy or you faint. This information is not intended to replace advice given to you by your health care provider. Make sure you discuss any questions you have with your health care provider. Document Released: 03/19/2004 Document Revised: 01/09/2016 Document Reviewed: 12/10/2015 Elsevier Interactive Patient Education  2019 Elsevier Inc.   Abdominal Pain, Adult Abdominal pain can be caused by many things. Often, abdominal pain is not serious and it gets better with  no treatment or by being treated at home. However, sometimes abdominal pain is serious. Your health care provider will do a medical history and a physical exam to try to determine the cause of your abdominal pain. Follow these instructions at home:  Take over-the-counter and prescription medicines only as told by your health care provider. Do not take a laxative unless told by your health care provider.  Drink enough fluid to keep your urine clear or pale yellow.  Watch your condition for any changes.  Keep all follow-up visits as told by your health care provider. This is important. Contact a health care provider if:  Your abdominal pain changes or gets worse.  You are not hungry or you lose weight without trying.  You are constipated or have diarrhea for more than 2-3 days.  You have pain when you urinate or have a bowel movement.  Your abdominal pain wakes you up at night.  Your pain gets worse with meals, after eating, or with certain foods.  You are throwing up and cannot keep anything down.  You have a fever. Get help right away if:  Your pain does not go away as soon as your health care provider  told you to expect.  You cannot stop throwing up.  Your pain is only in areas of the abdomen, such as the right side or the left lower portion of the abdomen.  You have bloody or black stools, or stools that look like tar.  You have severe pain, cramping, or bloating in your abdomen.  You have signs of dehydration, such as: ? Dark urine, very little urine, or no urine. ? Cracked lips. ? Dry mouth. ? Sunken eyes. ? Sleepiness. ? Weakness. This information is not intended to replace advice given to you by your health care provider. Make sure you discuss any questions you have with your health care provider. Document Released: 03/31/2005 Document Revised: 01/09/2016 Document Reviewed: 12/03/2015 Elsevier Interactive Patient Education  2019 [employer]    I discussed the assessment and treatment plan with the patient. The patient was provided an opportunity to ask questions and all were answered. The patient agreed with the plan and demonstrated an understanding of the instructions.   The patient was advised to call back or seek an in-person evaluation if the symptoms worsen or if the condition fails to improve as anticipated.  I provided 14 minutes of non-face-to-face time during this encounter.   Shade FloodJeffrey R Locklan Canoy, MD

## 2018-10-11 NOTE — Patient Instructions (Signed)
Based on her symptoms I think it is reasonable to try MiraLAX initially to see if some of your symptoms could be from constipation.  If not improving in the next few days, we can do other testing including urine testing but that would be less likely without urinary symptoms.  We will set you up for a follow-up visit, but if any worsening symptoms sooner let us know.  Return to the clinic or go to the nearest emergency room if any of your symptoms worsen or new symptoms occur.   Constipation, Adult Constipation is when a person has fewer bowel movements in a week than normal, has difficulty having a bowel movement, or has stools that are dry, hard, or larger than normal. Constipation may be caused by an underlying condition. It may become worse with age if a person takes certain medicines and does not take in enough fluids. Follow these instructions at home: Eating and drinking   Eat foods that have a lot of fiber, such as fresh fruits and vegetables, whole grains, and beans.  Limit foods that are high in fat, low in fiber, or overly processed, such as french fries, hamburgers, cookies, candies, and soda.  Drink enough fluid to keep your urine clear or pale yellow. General instructions  Exercise regularly or as told by your health care provider.  Go to the restroom when you have the urge to go. Do not hold it in.  Take over-the-counter and prescription medicines only as told by your health care provider. These include any fiber supplements.  Practice pelvic floor retraining exercises, such as deep breathing while relaxing the lower abdomen and pelvic floor relaxation during bowel movements.  Watch your condition for any changes.  Keep all follow-up visits as told by your health care provider. This is important. Contact a health care provider if:  You have pain that gets worse.  You have a fever.  You do not have a bowel movement after 4 days.  You vomit.  You are not hungry.   You lose weight.  You are bleeding from the anus.  You have thin, pencil-like stools. Get help right away if:  You have a fever and your symptoms suddenly get worse.  You leak stool or have blood in your stool.  Your abdomen is bloated.  You have severe pain in your abdomen.  You feel dizzy or you faint. This information is not intended to replace advice given to you by your health care provider. Make sure you discuss any questions you have with your health care provider. Document Released: 03/19/2004 Document Revised: 01/09/2016 Document Reviewed: 12/10/2015 Elsevier Interactive Patient Education  2019 Elsevier Inc.   Abdominal Pain, Adult Abdominal pain can be caused by many things. Often, abdominal pain is not serious and it gets better with no treatment or by being treated at home. However, sometimes abdominal pain is serious. Your health care provider will do a medical history and a physical exam to try to determine the cause of your abdominal pain. Follow these instructions at home:  Take over-the-counter and prescription medicines only as told by your health care provider. Do not take a laxative unless told by your health care provider.  Drink enough fluid to keep your urine clear or pale yellow.  Watch your condition for any changes.  Keep all follow-up visits as told by your health care provider. This is important. Contact a health care provider if:  Your abdominal pain changes or gets worse.  You are not  hungry or you lose weight without trying.  You are constipated or have diarrhea for more than 2-3 days.  You have pain when you urinate or have a bowel movement.  Your abdominal pain wakes you up at night.  Your pain gets worse with meals, after eating, or with certain foods.  You are throwing up and cannot keep anything down.  You have a fever. Get help right away if:  Your pain does not go away as soon as your health care provider told you to expect.   You cannot stop throwing up.  Your pain is only in areas of the abdomen, such as the right side or the left lower portion of the abdomen.  You have bloody or black stools, or stools that look like tar.  You have severe pain, cramping, or bloating in your abdomen.  You have signs of dehydration, such as: ? Dark urine, very little urine, or no urine. ? Cracked lips. ? Dry mouth. ? Sunken eyes. ? Sleepiness. ? Weakness. This information is not intended to replace advice given to you by your health care provider. Make sure you discuss any questions you have with your health care provider. Document Released: 03/31/2005 Document Revised: 01/09/2016 Document Reviewed: 12/03/2015 Elsevier Interactive Patient Education  2019 [employer].

## 2018-10-16 ENCOUNTER — Other Ambulatory Visit: Payer: Self-pay

## 2018-10-16 ENCOUNTER — Telehealth (INDEPENDENT_AMBULATORY_CARE_PROVIDER_SITE_OTHER): Payer: 59 | Admitting: Family Medicine

## 2018-10-16 DIAGNOSIS — R109 Unspecified abdominal pain: Secondary | ICD-10-CM | POA: Diagnosis not present

## 2018-10-16 NOTE — Patient Instructions (Addendum)
I am glad to hear that your symptoms have improved.  Constipation still could be causing some of the symptoms.  Can take MiraLAX half dose daily or every other day until regular bowel movements but stop if you experience diarrhea.  Increase fluid and fiber in the diet can also help prevent constipation.  As long as symptoms continue to improve this week, no further work-up is needed at this time.  If any point in time your abdominal pain gets worse or other associated symptoms such as nausea, vomiting or new urinary symptoms, or does not go away, schedule repeat visit.  Please let me know if there are questions.  Return to the clinic or go to the nearest emergency room if any of your symptoms worsen or new symptoms occur.   Abdominal Pain, Adult Abdominal pain can be caused by many things. Often, abdominal pain is not serious and it gets better with no treatment or by being treated at home. However, sometimes abdominal pain is serious. Your health care provider will do a medical history and a physical exam to try to determine the cause of your abdominal pain. Follow these instructions at home:  Take over-the-counter and prescription medicines only as told by your health care provider. Do not take a laxative unless told by your health care provider.  Drink enough fluid to keep your urine clear or pale yellow.  Watch your condition for any changes.  Keep all follow-up visits as told by your health care provider. This is important. Contact a health care provider if:  Your abdominal pain changes or gets worse.  You are not hungry or you lose weight without trying.  You are constipated or have diarrhea for more than 2-3 days.  You have pain when you urinate or have a bowel movement.  Your abdominal pain wakes you up at night.  Your pain gets worse with meals, after eating, or with certain foods.  You are throwing up and cannot keep anything down.  You have a fever. Get help right away  if:  Your pain does not go away as soon as your health care provider told you to expect.  You cannot stop throwing up.  Your pain is only in areas of the abdomen, such as the right side or the left lower portion of the abdomen.  You have bloody or black stools, or stools that look like tar.  You have severe pain, cramping, or bloating in your abdomen.  You have signs of dehydration, such as: ? Dark urine, very little urine, or no urine. ? Cracked lips. ? Dry mouth. ? Sunken eyes. ? Sleepiness. ? Weakness. This information is not intended to replace advice given to you by your health care provider. Make sure you discuss any questions you have with your health care provider. Document Released: 03/31/2005 Document Revised: 01/09/2016 Document Reviewed: 12/03/2015 Elsevier Interactive Patient Education  [employer]2019 Elsevier Inc.    If you have lab work done today you will be contacted with your lab results within the next 2 weeks.  If you have not heard from us then please contact us. The fastest way to get your results is to register for My Chart.   IF you received an x-ray today, you will receive an invoice from [facility]. Please contact [facility] at [phone] with questions or concerns regarding your invoice.   IF you received labwork today, you will receive an invoice from [facility]. Please contact [facility] at [phone] with questions or concerns regarding  your invoice.   Our billing staff will not be able to assist you with questions regarding bills from these companies.  You will be contacted with the lab results as soon as they are available. The fastest way to get your results is to activate your My Chart account. Instructions are located on the last page of this paperwork. If you have not heard from [place] regarding the results in 2 weeks, please contact this office.

## 2018-10-16 NOTE — Progress Notes (Signed)
Virtual Visit via Telephone Note  I connected with Jasmine Hurley on 10/16/18 at 4:54 PM by telephone and verified that I am speaking with the correct person using two identifiers.   I discussed the limitations, risks, security and privacy concerns of performing an evaluation and management service by telephone and the availability of in person appointments. I also discussed with the patient that there may be a patient responsible charge related to this service. The patient expressed understanding and agreed to proceed, consent obtained  Chief complaint:  Follow-up abdominal pain, rib pain, back pain.  History of Present Illness:  Follow-up of telemedicine visit 5 days ago.  Migratory pain at that time with some improvement in back abdominal pain, seem to be more under her left rib at that time.  No other concerning symptoms on her history but did have some hard stool, likely constipation that may have been contributing.  She was tried on MiraLAX/bowel regimen with plan for recheck within 2 days if not improving.  Pain has been better. Has not completely resolved, but dull, less often, not as sharp as pain. Just in stomach now. No rib pain or back pain.  No fever. No hematuria or other urinary symptoms. No vaginal d/c. No n/v.  Did take miralax once 3 days.No diarrhea. Last BM yesterday. Softer stool.  BM daily, but not yet today.     Patient Active Problem List   Diagnosis Date Noted  . Thyromegaly 01/15/2016   No past medical history on file. No past surgical history on file. No Known Allergies Prior to Admission medications   Not on File   Social History   Socioeconomic History  . Marital status: Single    Spouse name: Not on file  . Number of children: Not on file  . Years of education: Not on file  . Highest education level: Not on file  Occupational History  . Not on file  Social Needs  . Financial resource strain: Not on file  . Food insecurity:    Worry: Not on  file    Inability: Not on file  . Transportation needs:    Medical: Not on file    Non-medical: Not on file  Tobacco Use  . Smoking status: Never Smoker  . Smokeless tobacco: Never Used  Substance and Sexual Activity  . Alcohol use: Yes  . Drug use: No  . Sexual activity: Yes  Lifestyle  . Physical activity:    Days per week: Not on file    Minutes per session: Not on file  . Stress: Not on file  Relationships  . Social connections:    Talks on phone: Not on file    Gets together: Not on file    Attends religious service: Not on file    Active member of club or organization: Not on file    Attends meetings of clubs or organizations: Not on file    Relationship status: Not on file  . Intimate partner violence:    Fear of current or ex partner: Not on file    Emotionally abused: Not on file    Physically abused: Not on file    Forced sexual activity: Not on file  Other Topics Concern  . Not on file  Social History Narrative  . Not on file     Observations/Objective: No distress on phone.   Assessment and Plan: Abdominal pain, unspecified abdominal location  -Improvement with use of MiraLAX, still suspect some component of constipation.  Resolution of back and rib pain.  -Continue MiraLAX for now, bowel regimen discussed with handout on constipation last visit, recheck if not continuing to improve within the next 5 days, RTC precautions if worsening.  Follow Up Instructions: Prn  Patient Instructions    I am glad to hear that your symptoms have improved.  Constipation still could be causing some of the symptoms.  Can take MiraLAX half dose daily or every other day until regular bowel movements but stop if you experience diarrhea.  Increase fluid and fiber in the diet can also help prevent constipation.  As long as symptoms continue to improve this week, no further work-up is needed at this time.  If any point in time your abdominal pain gets worse or other associated  symptoms such as nausea, vomiting or new urinary symptoms, or does not go away, schedule repeat visit.  Please let me know if there are questions.  Return to the clinic or go to the nearest emergency room if any of your symptoms worsen or new symptoms occur.   Abdominal Pain, Adult Abdominal pain can be caused by many things. Often, abdominal pain is not serious and it gets better with no treatment or by being treated at home. However, sometimes abdominal pain is serious. Your health care provider will do a medical history and a physical exam to try to determine the cause of your abdominal pain. Follow these instructions at home:  Take over-the-counter and prescription medicines only as told by your health care provider. Do not take a laxative unless told by your health care provider.  Drink enough fluid to keep your urine clear or pale yellow.  Watch your condition for any changes.  Keep all follow-up visits as told by your health care provider. This is important. Contact a health care provider if:  Your abdominal pain changes or gets worse.  You are not hungry or you lose weight without trying.  You are constipated or have diarrhea for more than 2-3 days.  You have pain when you urinate or have a bowel movement.  Your abdominal pain wakes you up at night.  Your pain gets worse with meals, after eating, or with certain foods.  You are throwing up and cannot keep anything down.  You have a fever. Get help right away if:  Your pain does not go away as soon as your health care provider told you to expect.  You cannot stop throwing up.  Your pain is only in areas of the abdomen, such as the right side or the left lower portion of the abdomen.  You have bloody or black stools, or stools that look like tar.  You have severe pain, cramping, or bloating in your abdomen.  You have signs of dehydration, such as: ? Dark urine, very little urine, or no urine. ? Cracked lips. ?  Dry mouth. ? Sunken eyes. ? Sleepiness. ? Weakness. This information is not intended to replace advice given to you by your health care provider. Make sure you discuss any questions you have with your health care provider. Document Released: 03/31/2005 Document Revised: 01/09/2016 Document Reviewed: 12/03/2015 Elsevier Interactive Patient Education  [employer].    If you have lab work done today you will be contacted with your lab results within the next 2 weeks.  If you have not heard from [place] then please contact us. The fastest way to get your results is to register for My Chart.   IF you received an  x-ray today, you will receive an invoice from [facility]. Please contact [facility] at [phone] with questions or concerns regarding your invoice.   IF you received labwork today, you will receive an invoice from [facility]. Please contact [facility] at [phone] with questions or concerns regarding your invoice.   Our billing staff will not be able to assist you with questions regarding bills from these companies.  You will be contacted with the lab results as soon as they are available. The fastest way to get your results is to activate your My Chart account. Instructions are located on the last page of this paperwork. If you have not heard from [place] regarding the results in 2 weeks, please contact this office.           I discussed the assessment and treatment plan with the patient. The patient was provided an opportunity to ask questions and all were answered. The patient agreed with the plan and demonstrated an understanding of the instructions.   The patient was advised to call back or seek an in-person evaluation if the symptoms worsen or if the condition fails to improve as anticipated.  I provided 7 minutes of non-face-to-face time during this encounter.  Signed,   Meredith Staggers, MD [facility] at [facility].  10/16/18

## 2018-10-16 NOTE — Progress Notes (Signed)
CC- follow up abd pain- Abd pain have went away and  Feeling much better, not having any issues at this time. Patient's phone is acting up you will have to leave a msg then once she get the msg she can call back. For some reason she do not know why all her calls just go straight to vm. Not ringing at all.

## 2018-11-07 ENCOUNTER — Encounter

## 2018-11-07 ENCOUNTER — Ambulatory Visit: Payer: 59 | Admitting: Family Medicine

## 2018-12-14 ENCOUNTER — Other Ambulatory Visit: Payer: Self-pay

## 2018-12-14 ENCOUNTER — Ambulatory Visit (INDEPENDENT_AMBULATORY_CARE_PROVIDER_SITE_OTHER): Payer: Medicaid Other | Admitting: Family Medicine

## 2018-12-14 DIAGNOSIS — E785 Hyperlipidemia, unspecified: Secondary | ICD-10-CM

## 2018-12-14 DIAGNOSIS — Z3042 Encounter for surveillance of injectable contraceptive: Secondary | ICD-10-CM | POA: Diagnosis not present

## 2018-12-14 NOTE — Progress Notes (Signed)
Gave injection in the RUOQ, pt tolerated well.

## 2019-02-22 ENCOUNTER — Ambulatory Visit: Payer: Medicaid Other | Admitting: Family Medicine

## 2019-02-23 ENCOUNTER — Encounter: Payer: Self-pay | Admitting: Family Medicine

## 2019-02-26 ENCOUNTER — Encounter: Payer: Self-pay | Admitting: Family Medicine

## 2019-02-26 ENCOUNTER — Other Ambulatory Visit (HOSPITAL_COMMUNITY)
Admission: RE | Admit: 2019-02-26 | Discharge: 2019-02-26 | Disposition: A | Discharge status: Home / Self Care | Payer: Self-pay | Source: Ambulatory Visit | Attending: Family Medicine | Admitting: Family Medicine

## 2019-02-26 ENCOUNTER — Ambulatory Visit (INDEPENDENT_AMBULATORY_CARE_PROVIDER_SITE_OTHER): Payer: Self-pay | Admitting: Family Medicine

## 2019-02-26 ENCOUNTER — Other Ambulatory Visit: Payer: Self-pay

## 2019-02-26 VITALS — BP 123/81 | HR 73 | Temp 99.0°F | Resp 12 | Wt 179.0 lb

## 2019-02-26 DIAGNOSIS — N730 Acute parametritis and pelvic cellulitis: Secondary | ICD-10-CM

## 2019-02-26 DIAGNOSIS — R1032 Left lower quadrant pain: Secondary | ICD-10-CM

## 2019-02-26 DIAGNOSIS — Z113 Encounter for screening for infections with a predominantly sexual mode of transmission: Secondary | ICD-10-CM

## 2019-02-26 DIAGNOSIS — N898 Other specified noninflammatory disorders of vagina: Secondary | ICD-10-CM

## 2019-02-26 LAB — POCT URINALYSIS DIP (MANUAL ENTRY)
Bilirubin, UA: NEGATIVE
Glucose, UA: NEGATIVE mg/dL
Ketones, POC UA: NEGATIVE mg/dL
Leukocytes, UA: NEGATIVE
Nitrite, UA: NEGATIVE
Protein Ur, POC: NEGATIVE mg/dL
Spec Grav, UA: 1.025 (ref 1.010–1.025)
Urobilinogen, UA: 0.2 E.U./dL
pH, UA: 7 (ref 5.0–8.0)

## 2019-02-26 LAB — POCT WET + KOH PREP
Trich by wet prep: ABSENT
Yeast by KOH: ABSENT
Yeast by wet prep: ABSENT

## 2019-02-26 LAB — POCT URINE PREGNANCY: Preg Test, Ur: NEGATIVE

## 2019-02-26 MED ORDER — CEFTRIAXONE SODIUM 1 G IJ SOLR
1.0000 g | Freq: Once | INTRAMUSCULAR | Status: AC
  Administered 2019-02-26: 1 g via INTRAMUSCULAR

## 2019-02-26 MED ORDER — DOXYCYCLINE HYCLATE 100 MG PO TABS: 100.0000 mg | ORAL_TABLET | Freq: Two times a day (BID) | ORAL | 0 refills | Status: DC

## 2019-02-26 NOTE — Patient Instructions (Addendum)
Start doxycycline twice per day.  Send me update in next 3 days, but if any worsening symptoms - return for recheck here or ER..  I will let you know if any concerns on lab work.   Return to the clinic or go to the nearest emergency room if any of your symptoms worsen or new symptoms occur.   Pelvic Inflammatory Disease  Pelvic inflammatory disease (PID) is caused by an infection in some or all of the female reproductive organs. The infection can be in the uterus, ovaries, fallopian tubes, or the surrounding tissues in the pelvis. PID can cause abdominal or pelvic pain that comes on suddenly (acute pelvic pain). PID is a serious infection because it can lead to lasting (chronic) pelvic pain or the inability to have children (infertility). What are the causes? This condition is most often caused by bacteria that is spread during sexual contact. It can also be caused by a bacterial infection of the vagina (bacterial vaginosis) that is not spread by sexual contact. This condition occurs when the infection is not treated and the bacteria travel upward from the vagina or cervix into the reproductive organs. Bacteria may also be introduced into the reproductive organs following:  The birth of a baby.  A miscarriage.  An abortion.  Major pelvic surgery.  The insertion of an intrauterine device (IUD).  A sexual assault. What increases the risk? You are more likely to develop this condition if you:  Are younger than 23 years of age.  Are sexually active at a young age.  Have a history of STI (sexually transmitted infection) or PID.  Do not regularly use barrier contraception methods, such as condoms.  Have multiple sexual partners.  Have sex with someone who has symptoms of an STI.  Use a vaginal douche.  Have recently had an IUD inserted. What are the signs or symptoms? Symptoms of this condition include:  Abdominal or pelvic pain.  Fever.  Chills.  Abnormal vaginal  discharge.  Abnormal uterine bleeding.  Unusual pain shortly after the end of a menstrual period.  Painful urination.  Pain with sex.  Nausea and vomiting. How is this diagnosed? This condition is diagnosed based on a pelvic exam and medical history. A pelvic exam can reveal signs of infection, inflammation, and discharge in the vagina and the surrounding tissues. It can also help to identify painful areas. You may also have tests, such as:  Lab tests, including a pregnancy test, blood tests, and a urine test.  Culture tests of the vagina and cervix to check for an STI.  Ultrasound.  A laparoscopic procedure to look inside the pelvis.  Examination of vaginal discharge under a microscope. How is this treated? This condition may be treated with:  Antibiotic medicines taken by mouth (orally). For more severe cases, antibiotics may be given through an IV at the hospital.  Surgery. This is rare. Surgery may be needed if other treatments do not help.  Efforts to stop the spread of the infection. Sexual partners may need to be treated if the infection is caused by an STI. It may take weeks until you are completely well. If you are diagnosed with PID, you should also be checked for HIV (human immunodeficiency virus). Your health care provider may test you for infection again 3 months after treatment. You should not have unprotected sex. Follow these instructions at home:  Take over-the-counter and prescription medicines only as told by your health care provider.  If you were prescribed  an antibiotic medicine, take it as told by your health care provider. Do not stop using the antibiotic even if you start to feel better.  Do not have sex until treatment is completed or as told by your health care provider. If PID is confirmed, your recent sexual partners will need treatment, especially if you had unprotected sex.  Keep all follow-up visits as told by your health care provider. This is  important. Contact a health care provider if:  You have increased or abnormal vaginal discharge.  Your pain does not improve.  You vomit.  You have a fever.  You cannot tolerate your medicines.  Your partner has an STI.  You have pain when you urinate. Get help right away if:  You have increased abdominal or pelvic pain.  You have chills.  Your symptoms are not better in 72 hours with treatment. Summary  Pelvic inflammatory disease (PID) is caused by an infection in some or all of the female reproductive organs.  PID is a serious infection because it can lead to lasting (chronic) pelvic pain or the inability to have children (infertility).  This infection is usually treated with antibiotic medicines.  Do not have sex until treatment is completed or as told by your health care provider. This information is not intended to replace advice given to you by your health care provider. Make sure you discuss any questions you have with your health care provider. Document Released: 06/21/2005 Document Revised: 03/09/2018 Document Reviewed: 03/14/2018 Elsevier Patient Education  [employer]2020 Else[employer] Inc.   If you have lab work done today you will be contacted with your lab results within the next 2 weeks.  If you have not heard from us then please contact us. The fastest way to get your results is to register for My Chart.   IF you received an x-ray today, you will receive an invoice from [facility]. Please contact [facility] at [phone] with questions or concerns regarding your invoice.   IF you received labwork today, you will receive an invoice from [facility]. Please contact [facility] at [phone] with questions or concerns regarding your invoice.   Our billing staff will not be able to assist you with questions regarding bills from these companies.  You will be contacted with the lab results as soon as they are available. The fastest way to get your results  is to activate your My Chart account. Instructions are located on the last page of this paperwork. If you have not heard from us regarding the results in 2 weeks, please contact this office.

## 2019-02-26 NOTE — Progress Notes (Signed)
Subjective:    Patient ID: Jasmine Hurley, female    DOB: 1995-11-07, 23 y.o.   MRN: [id number]  HPI Jasmine Hurley is a 23 y.o. female Presents today for: Chief Complaint  Patient presents with   Abdominal Pain    lower abd pain- Have stomach issues all the time but this one I have vaginal itching and discharge.  Patient states she is on bc but is spotting at this time.   Abdominal Pain: Lower abdominal pain - episodic, not constant. None in past week. Bloating in past at times.  No dysuria. No new urinary symptoms.  Vaginal discharge/itching- noticed new discharge past 2 weeks, yellowish color and some itching No recent antibiotics. No use of feminine cleansing products, aveeno soap and recently [employer].  Unprotected intercourse end of June. No recent STI testing.  Chlamydia infection 2 years ago.- similar but itching is new.  No fever, no abd/pelvic pain, no n/v.   On Depo-Provera for contraception, last injection given June 11. Some breakthrough bleeding. spotting at times.   Abdominal pain was discussed at telemedicine visit in April, thought to have component of constipation as improved with use of MiraLAX.  Patient Active Problem List   Diagnosis Date Noted   Thyromegaly 01/15/2016   No past medical history on file. No past surgical history on file. No Known Allergies Prior to Admission medications   Not on File   Social History   Socioeconomic History   Marital status: Single    Spouse name: Not on file   Number of children: Not on file   Years of education: Not on file   Highest education level: Not on file  Occupational History   Not on file  Social Needs   Financial resource strain: Not on file   Food insecurity    Worry: Not on file    Inability: Not on file   Transportation needs    Medical: Not on file    Non-medical: Not on file  Tobacco Use   Smoking status: Never Smoker   Smokeless tobacco: Never Used  Substance and Sexual  Activity   Alcohol use: Yes   Drug use: No   Sexual activity: Yes  Lifestyle   Physical activity    Days per week: Not on file    Minutes per session: Not on file   Stress: Not on file  Relationships   Social connections    Talks on phone: Not on file    Gets together: Not on file    Attends religious service: Not on file    Active member of club or organization: Not on file    Attends meetings of clubs or organizations: Not on file    Relationship status: Not on file   Intimate partner violence    Fear of current or ex partner: Not on file    Emotionally abused: Not on file    Physically abused: Not on file    Forced sexual activity: Not on file  Other Topics Concern   Not on file  Social History Narrative   Not on file    Review of Systems Per HPI.     Objective:   Physical Exam Vitals signs reviewed. Exam conducted with a chaperone present.  Constitutional:      General: She is not in acute distress.    Appearance: She is well-developed.  HENT:     Head: Normocephalic and atraumatic.  Cardiovascular:     Rate and Rhythm:  Normal rate.  Pulmonary:     Effort: Pulmonary effort is normal.  Abdominal:     Tenderness: There is abdominal tenderness in the left lower quadrant.  Genitourinary:    Vagina: No signs of injury and foreign body. Vaginal discharge (in vault, not seen from cervix. small amt of dark blood in vault. ) present. No erythema or tenderness.     Cervix: Cervical motion tenderness present.     Uterus: Not tender.      Adnexa:        Right: No tenderness or fullness.         Left: No tenderness or fullness.    Neurological:     Mental Status: She is alert and oriented to person, place, and time.    Vitals:   02/26/19 1632  BP: 123/81  Pulse: 73  Resp: 12  Temp: 99 F (37.2 C)  TempSrc: Oral  SpO2: 98%  Weight: 179 lb (81.2 kg)     Results for orders placed or performed in visit on 02/26/19  POCT urinalysis dipstick  Result  Value Ref Range   Color, UA yellow yellow   Clarity, UA clear clear   Glucose, UA negative negative mg/dL   Bilirubin, UA negative negative   Ketones, POC UA negative negative mg/dL   Spec Grav, UA 1.6101.025 9.6041.010 - 1.025   Blood, UA trace-intact (A) negative   pH, UA 7.0 5.0 - 8.0   Protein Ur, POC negative negative mg/dL   Urobilinogen, UA 0.2 0.2 or 1.0 E.U./dL   Nitrite, UA Negative Negative   Leukocytes, UA Negative Negative  POCT urine pregnancy  Result Value Ref Range   Preg Test, Ur Negative Negative   Results for orders placed or performed in visit on 02/26/19  HIV Antibody (routine testing w rflx)  Result Value Ref Range   HIV Screen 4th Generation wRfx Non Reactive Non Reactive  RPR  Result Value Ref Range   RPR Ser Ql Non Reactive Non Reactive  CBC  Result Value Ref Range   WBC 4.3 3.4 - 10.8 x10E3/uL   RBC 4.44 3.77 - 5.28 x10E6/uL   Hemoglobin 13.0 11.1 - 15.9 g/dL   Hematocrit 54.040.4 98.134.0 - 46.6 %   MCV 91 79 - 97 fL   MCH 29.3 26.6 - 33.0 pg   MCHC 32.2 31.5 - 35.7 g/dL   RDW 19.112.6 47.811.7 - 29.515.4 %   Platelets 312 150 - 450 x10E3/uL  POCT urinalysis dipstick  Result Value Ref Range   Color, UA yellow yellow   Clarity, UA clear clear   Glucose, UA negative negative mg/dL   Bilirubin, UA negative negative   Ketones, POC UA negative negative mg/dL   Spec Grav, UA 6.2131.025 0.8651.010 - 1.025   Blood, UA trace-intact (A) negative   pH, UA 7.0 5.0 - 8.0   Protein Ur, POC negative negative mg/dL   Urobilinogen, UA 0.2 0.2 or 1.0 E.U./dL   Nitrite, UA Negative Negative   Leukocytes, UA Negative Negative  POCT urine pregnancy  Result Value Ref Range   Preg Test, Ur Negative Negative  POCT Wet + KOH Prep  Result Value Ref Range   Yeast by KOH Absent Absent   Yeast by wet prep Absent Absent   WBC by wet prep Few Few   Clue Cells Wet Prep HPF POC Few (A) None   Trich by wet prep Absent Absent   Bacteria Wet Prep HPF POC Moderate (A) Few   Epithelial Cells By  Wet Pref  ([facility]) Many (A) None, Few, Too numerous to count   RBC,UR,HPF,POC Few (A) None RBC/hpf       Assessment & Plan:    Jasmine Hurley is a 23 y.o. female PID (acute pelvic inflammatory disease) - Plan: cefTRIAXone (ROCEPHIN) injection 1 g, doxycycline (VIBRA-TABS) 100 MG tablet, DISCONTINUED: doxycycline (VIBRA-TABS) 100 MG tablet  LLQ abdominal pain - Plan: POCT urinalysis dipstick, POCT urine pregnancy, CBC  Vaginal discharge  Routine screening for STI (sexually transmitted infection) - Plan: POCT Wet + KOH Prep, GC/Chlamydia probe amp ([place])not at [facility], HIV Antibody (routine testing w rflx), RPR, CBC  Exam concerning for possible pelvic inflammatory disease with vaginal discharge, cervical motion tenderness, some adnexal tenderness on external exam, less with bimanual exam.  hCG, urinalysis reassuring.  -Rocephin 1 g IM, doxycycline 100 mg twice daily.  Initial outpatient treatment approach with RTC/ER precautions given, update by MyChart next few days.  STI testing as above.  Meds ordered this encounter  Medications   cefTRIAXone (ROCEPHIN) injection 1 g   DISCONTD: doxycycline (VIBRA-TABS) 100 MG tablet    Sig: Take 1 tablet (100 mg total) by mouth 2 (two) times daily.    Dispense:  20 tablet    Refill:  0   doxycycline (VIBRA-TABS) 100 MG tablet    Sig: Take 1 tablet (100 mg total) by mouth 2 (two) times daily.    Dispense:  28 tablet    Refill:  0   Patient Instructions   Start doxycycline twice per day.  Send me update in next 3 days, but if any worsening symptoms - return for recheck here or ER..  I will let you know if any concerns on lab work.   Return to the clinic or go to the nearest emergency room if any of your symptoms worsen or new symptoms occur.   Pelvic Inflammatory Disease  Pelvic inflammatory disease (PID) is caused by an infection in some or all of the female reproductive organs. The infection can be in the uterus, ovaries, fallopian tubes, or  the surrounding tissues in the pelvis. PID can cause abdominal or pelvic pain that comes on suddenly (acute pelvic pain). PID is a serious infection because it can lead to lasting (chronic) pelvic pain or the inability to have children (infertility). What are the causes? This condition is most often caused by bacteria that is spread during sexual contact. It can also be caused by a bacterial infection of the vagina (bacterial vaginosis) that is not spread by sexual contact. This condition occurs when the infection is not treated and the bacteria travel upward from the vagina or cervix into the reproductive organs. Bacteria may also be introduced into the reproductive organs following:  The birth of a baby.  A miscarriage.  An abortion.  Major pelvic surgery.  The insertion of an intrauterine device (IUD).  A sexual assault. What increases the risk? You are more likely to develop this condition if you:  Are younger than 23 years of age.  Are sexually active at a young age.  Have a history of STI (sexually transmitted infection) or PID.  Do not regularly use barrier contraception methods, such as condoms.  Have multiple sexual partners.  Have sex with someone who has symptoms of an STI.  Use a vaginal douche.  Have recently had an IUD inserted. What are the signs or symptoms? Symptoms of this condition include:  Abdominal or pelvic pain.  Fever.  Chills.  Abnormal vaginal discharge.  Abnormal uterine bleeding.  Unusual pain shortly after the end of a menstrual period.  Painful urination.  Pain with sex.  Nausea and vomiting. How is this diagnosed? This condition is diagnosed based on a pelvic exam and medical history. A pelvic exam can reveal signs of infection, inflammation, and discharge in the vagina and the surrounding tissues. It can also help to identify painful areas. You may also have tests, such as:  Lab tests, including a pregnancy test, blood tests,  and a urine test.  Culture tests of the vagina and cervix to check for an STI.  Ultrasound.  A laparoscopic procedure to look inside the pelvis.  Examination of vaginal discharge under a microscope. How is this treated? This condition may be treated with:  Antibiotic medicines taken by mouth (orally). For more severe cases, antibiotics may be given through an IV at the hospital.  Surgery. This is rare. Surgery may be needed if other treatments do not help.  Efforts to stop the spread of the infection. Sexual partners may need to be treated if the infection is caused by an STI. It may take weeks until you are completely well. If you are diagnosed with PID, you should also be checked for HIV (human immunodeficiency virus). Your health care provider may test you for infection again 3 months after treatment. You should not have unprotected sex. Follow these instructions at home:  Take over-the-counter and prescription medicines only as told by your health care provider.  If you were prescribed an antibiotic medicine, take it as told by your health care provider. Do not stop using the antibiotic even if you start to feel better.  Do not have sex until treatment is completed or as told by your health care provider. If PID is confirmed, your recent sexual partners will need treatment, especially if you had unprotected sex.  Keep all follow-up visits as told by your health care provider. This is important. Contact a health care provider if:  You have increased or abnormal vaginal discharge.  Your pain does not improve.  You vomit.  You have a fever.  You cannot tolerate your medicines.  Your partner has an STI.  You have pain when you urinate. Get help right away if:  You have increased abdominal or pelvic pain.  You have chills.  Your symptoms are not better in 72 hours with treatment. Summary  Pelvic inflammatory disease (PID) is caused by an infection in some or all of  the female reproductive organs.  PID is a serious infection because it can lead to lasting (chronic) pelvic pain or the inability to have children (infertility).  This infection is usually treated with antibiotic medicines.  Do not have sex until treatment is completed or as told by your health care provider. This information is not intended to replace advice given to you by your health care provider. Make sure you discuss any questions you have with your health care provider. Document Released: 06/21/2005 Document Revised: 03/09/2018 Document Reviewed: 03/14/2018 Elsevier Patient Education  [employer].   If you have lab work done today you will be contacted with your lab results within the next 2 weeks.  If you have not heard from [place] then please contact us. The fastest way to get your results is to register for My Chart.   IF you received an x-ray today, you will receive an invoice from [facility]. Please contact [facility] at [phone] with questions or concerns regarding your invoice.  IF you received labwork today, you will receive an invoice from [facility]. Please contact [facility] at [phone] with questions or concerns regarding your invoice.   Our billing staff will not be able to assist you with questions regarding bills from these companies.  You will be contacted with the lab results as soon as they are available. The fastest way to get your results is to activate your My Chart account. Instructions are located on the last page of this paperwork. If you have not heard from us regarding the results in 2 weeks, please contact this office.       Signed,   Meredith StaggersJeffrey Thersia Petraglia, MD Primary Care at [facility].  02/27/19 3:06 PM

## 2019-02-27 LAB — CBC
Hematocrit: 40.4 % (ref 34.0–46.6)
Hemoglobin: 13 g/dL (ref 11.1–15.9)
MCH: 29.3 pg (ref 26.6–33.0)
MCHC: 32.2 g/dL (ref 31.5–35.7)
MCV: 91 fL (ref 79–97)
Platelets: 312 10*3/uL (ref 150–450)
RBC: 4.44 x10E6/uL (ref 3.77–5.28)
RDW: 12.6 % (ref 11.7–15.4)
WBC: 4.3 10*3/uL (ref 3.4–10.8)

## 2019-02-27 LAB — HIV ANTIBODY (ROUTINE TESTING W REFLEX): HIV Screen 4th Generation wRfx: NONREACTIVE

## 2019-02-27 LAB — RPR: RPR Ser Ql: NONREACTIVE

## 2019-02-28 LAB — GC/CHLAMYDIA PROBE AMP (~~LOC~~) NOT AT ARMC
Chlamydia: NEGATIVE
Neisseria Gonorrhea: NEGATIVE

## 2019-03-16 ENCOUNTER — Ambulatory Visit: Payer: Medicaid Other | Admitting: Family Medicine

## 2019-03-19 ENCOUNTER — Ambulatory Visit: Payer: Medicaid Other | Admitting: Family Medicine

## 2019-03-20 ENCOUNTER — Encounter: Payer: Self-pay | Admitting: Family Medicine

## 2019-04-19 ENCOUNTER — Ambulatory Visit (INDEPENDENT_AMBULATORY_CARE_PROVIDER_SITE_OTHER): Payer: Medicaid Other | Admitting: Family Medicine

## 2019-04-19 ENCOUNTER — Encounter: Payer: Self-pay | Admitting: Family Medicine

## 2019-04-19 ENCOUNTER — Other Ambulatory Visit: Payer: Self-pay

## 2019-04-19 VITALS — BP 126/76 | HR 83 | Temp 99.0°F | Wt 182.6 lb

## 2019-04-19 DIAGNOSIS — Z111 Encounter for screening for respiratory tuberculosis: Secondary | ICD-10-CM

## 2019-04-19 DIAGNOSIS — Z3042 Encounter for surveillance of injectable contraceptive: Secondary | ICD-10-CM | POA: Diagnosis not present

## 2019-04-19 LAB — POCT URINE PREGNANCY: Preg Test, Ur: NEGATIVE

## 2019-04-19 MED ORDER — MEDROXYPROGESTERONE ACETATE 150 MG/ML IM SUSP
150.0000 mg | Freq: Once | INTRAMUSCULAR | Status: AC
  Administered 2019-04-19: 12:00:00 150 mg via INTRAMUSCULAR

## 2019-04-21 ENCOUNTER — Encounter: Payer: Self-pay | Admitting: Family Medicine

## 2019-04-21 NOTE — Progress Notes (Signed)
QuantiFERON testing and Depo shot only, patient not seen.  Nurse visit only.

## 2019-04-23 ENCOUNTER — Ambulatory Visit (INDEPENDENT_AMBULATORY_CARE_PROVIDER_SITE_OTHER): Payer: Self-pay | Admitting: Family Medicine

## 2019-04-23 ENCOUNTER — Other Ambulatory Visit: Payer: Self-pay

## 2019-04-23 DIAGNOSIS — Z111 Encounter for screening for respiratory tuberculosis: Secondary | ICD-10-CM

## 2019-04-23 NOTE — Progress Notes (Signed)
Pt came in the office under nursing schedule for TB skin Test

## 2019-04-26 ENCOUNTER — Other Ambulatory Visit: Payer: Self-pay

## 2019-04-26 ENCOUNTER — Ambulatory Visit (INDEPENDENT_AMBULATORY_CARE_PROVIDER_SITE_OTHER): Payer: Self-pay | Admitting: Family Medicine

## 2019-04-26 DIAGNOSIS — Z111 Encounter for screening for respiratory tuberculosis: Secondary | ICD-10-CM

## 2019-04-26 LAB — TB SKIN TEST
Induration: 0 mm
TB Skin Test: NEGATIVE

## 2019-04-26 NOTE — Progress Notes (Signed)
Patient had PPD read and the result was negative.

## 2019-05-03 ENCOUNTER — Telehealth: Payer: Self-pay | Admitting: Family Medicine

## 2019-05-03 ENCOUNTER — Ambulatory Visit (INDEPENDENT_AMBULATORY_CARE_PROVIDER_SITE_OTHER): Payer: Self-pay | Admitting: Family Medicine

## 2019-05-03 ENCOUNTER — Encounter: Payer: Self-pay | Admitting: Family Medicine

## 2019-05-03 ENCOUNTER — Ambulatory Visit (HOSPITAL_COMMUNITY): Payer: Self-pay

## 2019-05-03 ENCOUNTER — Other Ambulatory Visit: Payer: Self-pay

## 2019-05-03 ENCOUNTER — Other Ambulatory Visit (HOSPITAL_COMMUNITY)
Admission: RE | Admit: 2019-05-03 | Discharge: 2019-05-03 | Disposition: A | Discharge status: Home / Self Care | Payer: Medicaid Other | Source: Ambulatory Visit | Attending: Obstetrics & Gynecology | Admitting: Obstetrics & Gynecology

## 2019-05-03 VITALS — BP 120/79 | HR 87 | Temp 98.2°F | Wt 184.8 lb

## 2019-05-03 DIAGNOSIS — N898 Other specified noninflammatory disorders of vagina: Secondary | ICD-10-CM | POA: Insufficient documentation

## 2019-05-03 DIAGNOSIS — R1030 Lower abdominal pain, unspecified: Secondary | ICD-10-CM

## 2019-05-03 DIAGNOSIS — R102 Pelvic and perineal pain: Secondary | ICD-10-CM

## 2019-05-03 LAB — POCT URINALYSIS DIP (MANUAL ENTRY)
Bilirubin, UA: NEGATIVE
Glucose, UA: NEGATIVE mg/dL
Ketones, POC UA: NEGATIVE mg/dL
Nitrite, UA: NEGATIVE
Protein Ur, POC: NEGATIVE mg/dL
Spec Grav, UA: >=1.03 — AB (ref 1.010–1.025)
Urobilinogen, UA: 0.2 E.U./dL
pH, UA: 6 (ref 5.0–8.0)

## 2019-05-03 LAB — POCT CBC
Granulocyte percent: 49.1 %G (ref 37–80)
HCT, POC: 38.8 % (ref 29–41)
Hemoglobin: 12.7 g/dL (ref 11–14.6)
Lymph, poc: 2.1 (ref 0.6–3.4)
MCH, POC: 29.4 pg — AB (ref 27–31.2)
MCHC: 32.7 g/dL — AB (ref 31.8–35.4)
MCV: 89.8 fL — AB (ref 76–111)
MID (cbc): 0.3 (ref 0–0.9)
MPV: 7.2 fL (ref 0–99.8)
POC Granulocyte: 2.4 (ref 2–6.9)
POC LYMPH PERCENT: 44.7 %L (ref 10–50)
POC MID %: 6.2 %M (ref 0–12)
Platelet Count, POC: 326 10*3/uL (ref 142–424)
RBC: 4.32 M/uL (ref 4.04–5.48)
RDW, POC: 13 %
WBC: 4.8 10*3/uL (ref 4.6–10.2)

## 2019-05-03 LAB — POCT URINE PREGNANCY: Preg Test, Ur: NEGATIVE

## 2019-05-03 MED ORDER — CEFTRIAXONE SODIUM 250 MG IJ SOLR
250.0000 mg | Freq: Once | INTRAMUSCULAR | Status: AC
  Administered 2019-05-03: 250 mg via INTRAMUSCULAR

## 2019-05-03 MED ORDER — DOXYCYCLINE HYCLATE 100 MG PO TABS: 100.0000 mg | ORAL_TABLET | Freq: Two times a day (BID) | ORAL | 0 refills | Status: DC

## 2019-05-03 NOTE — Progress Notes (Signed)
Subjective:    Patient ID: Jasmine Hurley, female    DOB: [birth year], 23 y.o.   MRN: [id number]  HPI Jasmine Hurley is a 23 y.o. female Presents today for: Chief Complaint  Patient presents with  . Abdominal Pain    abd, vaginal , and low back pain since 04/28/19 (No ins will have to pay out of pocket)   Presents with abdominal pain low back pain vaginal pain since October 24.  Last seen for possible pelvic inflammatory disease February 26, 2019 but negative STI testing at that time.Rocephin 1 g, doxycycline 100 mg twice daily given. Current symptoms started 6 days ago. Initially on left side of vagina, no swelling noted. Noticed with eating dinner - felt hard on that side.  Some green vaginal discharge - less now.  Pain has improved as well, still some yellow d/c.  Last sexually active in January - no new partners.  Last abx 1 month ago - Did not take doxycycline until 1 month after visit - cost issue, and had initially felt better. Pain did return for about 3 days - took doxycycline.  Some abd pain 2 days ago, some pelvic pain yesterday. Better today. Full sensation at times.  Low back pain 3 days ago - a little better. Left low back.  No history of kidney stone.  No fever, no dysuria, hematuria, or urgency/frequency.  Some vaginal itching past week.  No fever, initial nausea - better now. No vomiting.  Last BM yesterday. No blood. Occasional constipation, larger bowel movements recently, stomach upset.   On depoprovera for contraception. No missed doses, no breaakthrough bleeding.    Patient Active Problem List   Diagnosis Date Noted  . Thyromegaly 01/15/2016   No past medical history on file. No past surgical history on file. No Known Allergies Prior to Admission medications   Not on File   Social History   Socioeconomic History  . Marital status: Single    Spouse name: Not on file  . Number of children: Not on file  . Years of education: Not on file  . Highest  education level: Not on file  Occupational History  . Not on file  Social Needs  . Financial resource strain: Not on file  . Food insecurity    Worry: Not on file    Inability: Not on file  . Transportation needs    Medical: Not on file    Non-medical: Not on file  Tobacco Use  . Smoking status: Never Smoker  . Smokeless tobacco: Never Used  Substance and Sexual Activity  . Alcohol use: Yes  . Drug use: No  . Sexual activity: Yes  Lifestyle  . Physical activity    Days per week: Not on file    Minutes per session: Not on file  . Stress: Not on file  Relationships  . Social Musicianconnections    Talks on phone: Not on file    Gets together: Not on file    Attends religious service: Not on file    Active member of club or organization: Not on file    Attends meetings of clubs or organizations: Not on file    Relationship status: Not on file  . Intimate partner violence    Fear of current or ex partner: Not on file    Emotionally abused: Not on file    Physically abused: Not on file    Forced sexual activity: Not on file  Other Topics Concern  .  Not on file  Social History Narrative  . Not on file     Review of Systems Per HPI.     Objective:   Physical Exam Vitals signs reviewed.  Constitutional:      General: She is not in acute distress.    Appearance: She is well-developed.  HENT:     Head: Normocephalic and atraumatic.  Cardiovascular:     Rate and Rhythm: Normal rate.  Pulmonary:     Effort: Pulmonary effort is normal.  Abdominal:     Tenderness: There is abdominal tenderness in the left lower quadrant. There is no right CVA tenderness or left CVA tenderness.  Genitourinary:    Vagina: No signs of injury. Vaginal discharge and tenderness present. No bleeding.     Cervix: Cervical motion tenderness present. No discharge or friability.     Comments: Yellow d/c in vault. ttp LLQ. guarded bimanual exam - unable to palpate cervix/adnexa d/t guarding with vaginal  discomfort.  No bartholin cyst/abscess appreciated.  Neurological:     Mental Status: She is alert and oriented to person, place, and time.    Vitals:   05/03/19 1145  BP: 120/79  Pulse: 87  Temp: 98.2 F (36.8 C)  TempSrc: Oral  SpO2: 98%  Weight: 184 lb 12.8 oz (83.8 kg)   CBC: WBC 4.8, HGB 12.7, PLT 326  Results for orders placed or performed in visit on 05/03/19  POCT urinalysis dipstick  Result Value Ref Range   Color, UA yellow yellow   Clarity, UA clear clear   Glucose, UA negative negative mg/dL   Bilirubin, UA negative negative   Ketones, POC UA negative negative mg/dL   Spec Grav, UA >=1.030 (A) 1.010 - 1.025   Blood, UA moderate (A) negative   pH, UA 6.0 5.0 - 8.0   Protein Ur, POC negative negative mg/dL   Urobilinogen, UA 0.2 0.2 or 1.0 E.U./dL   Nitrite, UA Negative Negative   Leukocytes, UA Trace (A) Negative  POCT urine pregnancy  Result Value Ref Range   Preg Test, Ur Negative Negative       Assessment & Plan:    Jasmine Hurley is a 23 y.o. female Lower abdominal pain - Plan: POCT urinalysis dipstick, POCT urine pregnancy, POCT CBC, Urine Culture  Pelvic pain - Plan: POCT CBC, [place] Art/Ven Flow Abd Pelv Doppler, CANCELED: US Pelvis Complete  Vaginal pain  Vaginal discharge - Plan: POCT CBC, WET PREP FOR TRICH, YEAST, CLUE, GC/Chlamydia probe amp ([place])not at [facility], cefTRIAXone (ROCEPHIN) injection 250 mg, doxycycline (VIBRA-TABS) 100 MG tablet  Guarded exam, unable to completely perform bimanual exam.  Still concern for pelvic inflammatory disease, recurrence possible.  Initial treatment with Rocephin 250 mg, doxycycline 100 mg twice daily.  2-week course.  Pelvic ultrasound ordered to rule out torsion though less likely.  Constipation treatment also discussed as that may be contributing.  RTC precautions if acute worsening, otherwise plan to recheck in 4 days  Meds ordered this encounter  Medications  . cefTRIAXone (ROCEPHIN) injection 250  mg  . doxycycline (VIBRA-TABS) 100 MG tablet    Sig: Take 1 tablet (100 mg total) by mouth 2 (two) times daily.    Dispense:  28 tablet    Refill:  0   Patient Instructions    miralax if needed for constipation.  Start antibiotic along with injection of antibiotic today for possible pelvic inflammatory disease but I will schedule ultrasound as well to evaluate the ovary on the left  side.  There were a few infection fighting cells in the urine but will check a urine culture to evaluate for infection as well as a wet prep to look for external infection.  Recheck in 4 days unless any worsening symptoms prior to that time then be seen here, urgent care or ER if needed.   Return to the clinic or go to the nearest emergency room if any of your symptoms worsen or new symptoms occur.   Pelvic Pain, Female Pelvic pain is pain in your lower abdomen, below your belly button and between your hips. The pain may start suddenly (be acute), keep coming back (be recurring), or last a long time (become chronic). Pelvic pain that lasts longer than 6 months is considered chronic. Pelvic pain may affect your:  Reproductive organs.  Urinary system.  Digestive tract.  Musculoskeletal system. There are many potential causes of pelvic pain. Sometimes, the pain can be a result of digestive or urinary conditions, strained muscles or ligaments, or reproductive conditions. Sometimes the cause of pelvic pain is not known. Follow these instructions at home:   Take over-the-counter and prescription medicines only as told by your health care provider.  Rest as told by your health care provider.  Do not have sex if it hurts.  Keep a journal of your pelvic pain. Write down: ? When the pain started. ? Where the pain is located. ? What seems to make the pain better or worse, such as food or your period (menstrual cycle). ? Any symptoms you have along with the pain.  Keep all follow-up visits as told by your  health care provider. This is important. Contact a health care provider if:  Medicine does not help your pain.  Your pain comes back.  You have new symptoms.  You have abnormal vaginal discharge or bleeding, including bleeding after menopause.  You have a fever or chills.  You are constipated.  You have blood in your urine or stool.  You have foul-smelling urine.  You feel weak or light-headed. Get help right away if:  You have sudden severe pain.  Your pain gets steadily worse.  You have severe pain along with fever, nausea, vomiting, or excessive sweating.  You lose consciousness. Summary  Pelvic pain is pain in your lower abdomen, below your belly button and between your hips.  There are many potential causes of pelvic pain.  Keep a journal of your pelvic pain. This information is not intended to replace advice given to you by your health care provider. Make sure you discuss any questions you have with your health care provider. Document Released: 05/18/2004 Document Revised: 12/07/2017 Document Reviewed: 12/07/2017 Elsevier Patient Education  [employer].     If you have lab work done today you will be contacted with your lab results within the next 2 weeks.  If you have not heard from [place] then please contact us. The fastest way to get your results is to register for My Chart.   IF you received an x-ray today, you will receive an invoice from [facility]. Please contact [facility] at [phone] with questions or concerns regarding your invoice.   IF you received labwork today, you will receive an invoice from [place]. Please contact [facility] at [phone] with questions or concerns regarding your invoice.   Our billing staff will not be able to assist you with questions regarding bills from these companies.  You will be contacted with the lab results as soon as they  are available. The fastest way to get your results is to activate  your My Chart account. Instructions are located on the last page of this paperwork. If you have not heard from [place] regarding the results in 2 weeks, please contact this office.       Signed,   Meredith Staggers, MD Primary Care at [facility].  05/06/19 2:02 PM

## 2019-05-03 NOTE — Patient Instructions (Addendum)
miralax if needed for constipation.  Start antibiotic along with injection of antibiotic today for possible pelvic inflammatory disease but I will schedule ultrasound as well to evaluate the ovary on the left side.  There were a few infection fighting cells in the urine but will check a urine culture to evaluate for infection as well as a wet prep to look for external infection.  Recheck in 4 days unless any worsening symptoms prior to that time then be seen here, urgent care or ER if needed.   Return to the clinic or go to the nearest emergency room if any of your symptoms worsen or new symptoms occur.   Pelvic Pain, Female Pelvic pain is pain in your lower abdomen, below your belly button and between your hips. The pain may start suddenly (be acute), keep coming back (be recurring), or last a long time (become chronic). Pelvic pain that lasts longer than 6 months is considered chronic. Pelvic pain may affect your:  Reproductive organs.  Urinary system.  Digestive tract.  Musculoskeletal system. There are many potential causes of pelvic pain. Sometimes, the pain can be a result of digestive or urinary conditions, strained muscles or ligaments, or reproductive conditions. Sometimes the cause of pelvic pain is not known. Follow these instructions at home:   Take over-the-counter and prescription medicines only as told by your health care provider.  Rest as told by your health care provider.  Do not have sex if it hurts.  Keep a journal of your pelvic pain. Write down: ? When the pain started. ? Where the pain is located. ? What seems to make the pain better or worse, such as food or your period (menstrual cycle). ? Any symptoms you have along with the pain.  Keep all follow-up visits as told by your health care provider. This is important. Contact a health care provider if:  Medicine does not help your pain.  Your pain comes back.  You have new symptoms.  You have abnormal  vaginal discharge or bleeding, including bleeding after menopause.  You have a fever or chills.  You are constipated.  You have blood in your urine or stool.  You have foul-smelling urine.  You feel weak or light-headed. Get help right away if:  You have sudden severe pain.  Your pain gets steadily worse.  You have severe pain along with fever, nausea, vomiting, or excessive sweating.  You lose consciousness. Summary  Pelvic pain is pain in your lower abdomen, below your belly button and between your hips.  There are many potential causes of pelvic pain.  Keep a journal of your pelvic pain. This information is not intended to replace advice given to you by your health care provider. Make sure you discuss any questions you have with your health care provider. Document Released: 05/18/2004 Document Revised: 12/07/2017 Document Reviewed: 12/07/2017 Elsevier Patient Education  [employer].     If you have lab work done today you will be contacted with your lab results within the next 2 weeks.  If you have not heard from [place] then please contact us. The fastest way to get your results is to register for My Chart.   IF you received an x-ray today, you will receive an invoice from [facility]. Please contact [facility] at [phone] with questions or concerns regarding your invoice.   IF you received labwork today, you will receive an invoice from [place]. Please contact [facility] at [phone] with questions or concerns regarding your  invoice.   Our billing staff will not be able to assist you with questions regarding bills from these companies.  You will be contacted with the lab results as soon as they are available. The fastest way to get your results is to activate your My Chart account. Instructions are located on the last page of this paperwork. If you have not heard from [place] regarding the results in 2 weeks, please contact this office.

## 2019-05-04 ENCOUNTER — Ambulatory Visit (HOSPITAL_COMMUNITY)
Admission: RE | Admit: 2019-05-04 | Discharge: 2019-05-04 | Disposition: A | Discharge status: Home / Self Care | Payer: Self-pay | Source: Ambulatory Visit | Attending: Family Medicine | Admitting: Family Medicine

## 2019-05-04 ENCOUNTER — Other Ambulatory Visit: Payer: Self-pay | Admitting: Family Medicine

## 2019-05-04 ENCOUNTER — Ambulatory Visit (HOSPITAL_COMMUNITY): Admission: RE | Admit: 2019-05-04 | Payer: Self-pay | Source: Ambulatory Visit

## 2019-05-04 DIAGNOSIS — R102 Pelvic and perineal pain: Secondary | ICD-10-CM | POA: Insufficient documentation

## 2019-05-04 LAB — WET PREP FOR TRICH, YEAST, CLUE
Clue Cell Exam: NEGATIVE
Trichomonas Exam: NEGATIVE
Yeast Exam: NEGATIVE

## 2019-05-04 LAB — GC/CHLAMYDIA PROBE AMP (~~LOC~~) NOT AT ARMC
Chlamydia: NEGATIVE
Comment: NEGATIVE
Comment: NORMAL
Neisseria Gonorrhea: NEGATIVE

## 2019-05-04 LAB — URINE CULTURE

## 2019-05-06 ENCOUNTER — Encounter: Payer: Self-pay | Admitting: Family Medicine

## 2019-05-07 ENCOUNTER — Ambulatory Visit (INDEPENDENT_AMBULATORY_CARE_PROVIDER_SITE_OTHER): Payer: Medicaid Other | Admitting: Family Medicine

## 2019-05-07 ENCOUNTER — Other Ambulatory Visit: Payer: Self-pay

## 2019-05-07 ENCOUNTER — Encounter: Payer: Self-pay | Admitting: Family Medicine

## 2019-05-07 VITALS — BP 133/83 | HR 84 | Temp 98.7°F | Resp 14 | Wt 185.2 lb

## 2019-05-07 DIAGNOSIS — R1030 Lower abdominal pain, unspecified: Secondary | ICD-10-CM

## 2019-05-07 DIAGNOSIS — N898 Other specified noninflammatory disorders of vagina: Secondary | ICD-10-CM

## 2019-05-07 DIAGNOSIS — M791 Myalgia, unspecified site: Secondary | ICD-10-CM

## 2019-05-07 LAB — POCT URINALYSIS DIP (MANUAL ENTRY)
Bilirubin, UA: NEGATIVE
Glucose, UA: NEGATIVE mg/dL
Ketones, POC UA: NEGATIVE mg/dL
Leukocytes, UA: NEGATIVE
Nitrite, UA: NEGATIVE
Protein Ur, POC: NEGATIVE mg/dL
Spec Grav, UA: >=1.03 — AB (ref 1.010–1.025)
Urobilinogen, UA: 0.2 E.U./dL
pH, UA: 6 (ref 5.0–8.0)

## 2019-05-07 LAB — POCT CBC
Granulocyte percent: 48.8 %G (ref 37–80)
HCT, POC: 39.5 % (ref 29–41)
Hemoglobin: 13 g/dL (ref 11–14.6)
Lymph, poc: 2.2 (ref 0.6–3.4)
MCH, POC: 29.5 pg (ref 27–31.2)
MCHC: 32.8 g/dL (ref 31.8–35.4)
MCV: 90 fL (ref 76–111)
MID (cbc): 0.1 (ref 0–0.9)
MPV: 7 fL (ref 0–99.8)
POC Granulocyte: 2.2 (ref 2–6.9)
POC LYMPH PERCENT: 48.1 %L (ref 10–50)
POC MID %: 3.1 %M (ref 0–12)
Platelet Count, POC: 355 10*3/uL (ref 142–424)
RBC: 4.39 M/uL (ref 4.04–5.48)
RDW, POC: 13.3 %
WBC: 4.6 10*3/uL (ref 4.6–10.2)

## 2019-05-07 LAB — POCT WET + KOH PREP
Trich by wet prep: ABSENT
Yeast by KOH: ABSENT
Yeast by wet prep: ABSENT

## 2019-05-07 LAB — POC MICROSCOPIC URINALYSIS (UMFC): Mucus: ABSENT

## 2019-05-07 NOTE — Progress Notes (Addendum)
Subjective:    Patient ID: Jasmine Hurley, female    DOB: [birth year], 23 y.o.   MRN: [id number]  HPI Jasmine Hurley is a 23 y.o. female Presents today for: Chief Complaint  Patient presents with   Follow-up    4 day f/u on abd and pelvic pain. Patient stated she have not started the doxycycline medication yet cause her out of pocket part is $80 would like to know if there is anything else can do or a another shot she can take.Still having abd pain   Abdominal pain/pelvic pain. Treated in August for possible PID, with Rocephin and doxycycline.  There was a delay in her starting doxycycline due to cost.  Ultimately STI testing was negative at that time.  On Depo-Provera for contraception. Previous missed doses or breakthrough bleeding. Recurrence of similar pain recently, starting approximately October 23.  Initially with left-sided vaginal pain, no swelling.  Some initial green vaginal discharge but decreased.  Abdominal and pelvic pain have been noted 2 days prior to her last visit on the 29th. Guarded exam including difficulty with bimanual exam due to vaginal discomfort, left pelvic discomfort.  Concern for possible PID again, treated with Rocephin 1 g, doxycycline 16m twice daily prescribed but was not able to start due to cost.  Did note some possible constipation history at last visit.  And treatments discussed.  Pelvic ultrasound with Doppler obtained to rule out ovarian torsion, abscess or other acute findings.  Testing was negative/normal as below.  Repeat STI testing with GC/chlamydia/wet prep also normal, as well as urine culture.  Feels like abd pain worse since last visit. More abdominal pain at night - works 2nd shift - more after work.  Whole stomach area, primarily lower middle. Some left lower back pain at times as well. Some diffuse body aches, hot and cold spells. No measured fever. Vaginal pain stopped, no new new discharge. Min vaginal itching. No new treatments.  Last  BM yesterday. Soft stool. No recent hard stools.  No hematuria/dysuria/urgency.  No vomiting. Slight nausea but less often. Eating and drinking ok.   No sick contacts. No change in taste/smell. No cough, no dyspnea.   Some stress with work, fPublishing rights manager Some abd pains in past in upper abdomen. Would note body aches in past when stressed.  Last sexually active in January. Denies physical or sexual abuse. Denies safety concerns.     Results for orders placed or performed in visit on 05/03/19  WET PREP FOR [place] YEAST, CLUE   Specimen: Vaginal Fluid   VA  Result Value Ref Range   Trichomonas Exam Negative Negative   Yeast Exam Negative Negative   Clue Cell Exam Negative Negative  Urine Culture   Specimen: Urine   UR  Result Value Ref Range   Urine Culture, Routine Final report    Organism ID, Bacteria Comment   POCT urinalysis dipstick  Result Value Ref Range   Color, UA yellow yellow   Clarity, UA clear clear   Glucose, UA negative negative mg/dL   Bilirubin, UA negative negative   Ketones, POC UA negative negative mg/dL   Spec Grav, UA >=1.030 (A) 1.010 - 1.025   Blood, UA moderate (A) negative   pH, UA 6.0 5.0 - 8.0   Protein Ur, POC negative negative mg/dL   Urobilinogen, UA 0.2 0.2 or 1.0 E.U./dL   Nitrite, UA Negative Negative   Leukocytes, UA Trace (A) Negative  POCT urine pregnancy  Result Value Ref Range  Preg Test, Ur Negative Negative  POCT CBC  Result Value Ref Range   WBC 4.8 4.6 - 10.2 K/uL   Lymph, poc 2.1 0.6 - 3.4   POC LYMPH PERCENT 44.7 10 - 50 %L   MID (cbc) 0.3 0 - 0.9   POC MID % 6.2 0 - 12 %M   POC Granulocyte 2.4 2 - 6.9   Granulocyte percent 49.1 37 - 80 %G   RBC 4.32 4.04 - 5.48 M/uL   Hemoglobin 12.7 11 - 14.6 g/dL   HCT, POC 38.8 29 - 41 %   MCV 89.8 (A) 76 - 111 fL   MCH, POC 29.4 (A) 27 - 31.2 pg   MCHC 32.7 (A) 31.8 - 35.4 g/dL   RDW, POC 13.0 %   Platelet Count, POC 326 142 - 424 K/uL   MPV 7.2 0 - 99.8 fL  GC/Chlamydia probe amp  ([place])not at [facility]  Result Value Ref Range   Neisseria Gonorrhea Negative    Chlamydia Negative    Comment Normal Reference Ranger Chlamydia - Negative    Comment      Normal Reference Range Neisseria Gonorrhea - Negative  . [place] Art/ven Flow Abd Pelv Doppler  Result Date: 05/04/2019 CLINICAL DATA:  Evaluate for torsion. EXAM: DOPPLER ULTRASOUND OF OVARIES TECHNIQUE: Color and duplex Doppler ultrasound was utilized to evaluate blood flow to the ovaries. COMPARISON:  01/29/2016. FINDINGS: Pulsed Doppler evaluation of both ovaries demonstrates normal low-resistance arterial and venous waveforms. IMPRESSION: No evidence of torsion. Electronically Signed   By: Marcello Moores  Register   On: 05/04/2019 15:14   US Pelvic Complete With Transvaginal  Result Date: 05/04/2019 CLINICAL DATA:  Pelvic pain for 6 days EXAM: TRANSABDOMINAL AND TRANSVAGINAL ULTRASOUND OF PELVIS DOPPLER ULTRASOUND OF OVARIES TECHNIQUE: Both transabdominal and transvaginal ultrasound examinations of the pelvis were performed. Transabdominal technique was performed for global imaging of the pelvis including uterus, ovaries, adnexal regions, and pelvic cul-de-sac. It was necessary to proceed with endovaginal exam following the transabdominal exam to visualize the endometrium and ovaries. Color and duplex Doppler ultrasound was utilized to evaluate blood flow to the ovaries. COMPARISON:  None. FINDINGS: Uterus Measurements: 6.5 x 3.4 x 4.4 cm = volume: 51 mL. No fibroids or other mass visualized. Endometrium Thickness: 2.7 mm.  No focal abnormality visualized. Right ovary Measurements: 2.9 x 1.6 x 2.5 cm = volume: 6 mL. Normal appearance/no adnexal mass. Left ovary Measurements: 3.6 x 1.9 x 2.9 cm = volume: 9.9 mL. Normal appearance/no adnexal mass. Pulsed Doppler evaluation of both ovaries demonstrates normal low-resistance arterial and venous waveforms. Other findings No abnormal free fluid. IMPRESSION: Unremarkable ultrasound of the  pelvis. Electronically Signed   By: Macy Mis M.D.   On: 05/04/2019 15:36     Patient Active Problem List   Diagnosis Date Noted   Thyromegaly 01/15/2016   No past medical history on file. No past surgical history on file. No Known Allergies Prior to Admission medications   Medication Sig Start Date End Date Taking? Authorizing Provider  doxycycline (VIBRA-TABS) 100 MG tablet Take 1 tablet (100 mg total) by mouth 2 (two) times daily. Patient not taking: Reported on 05/07/2019 05/03/19   Wendie Agreste, MD   Social History   Socioeconomic History   Marital status: Single    Spouse name: Not on file   Number of children: Not on file   Years of education: Not on file   Highest education level: Not on file  Occupational History  Not on file  Social Needs   Financial resource strain: Not on file   Food insecurity    Worry: Not on file    Inability: Not on file   Transportation needs    Medical: Not on file    Non-medical: Not on file  Tobacco Use   Smoking status: Never Smoker   Smokeless tobacco: Never Used  Substance and Sexual Activity   Alcohol use: Yes   Drug use: No   Sexual activity: Yes  Lifestyle   Physical activity    Days per week: Not on file    Minutes per session: Not on file   Stress: Not on file  Relationships   Social connections    Talks on phone: Not on file    Gets together: Not on file    Attends religious service: Not on file    Active member of club or organization: Not on file    Attends meetings of clubs or organizations: Not on file    Relationship status: Not on file   Intimate partner violence    Fear of current or ex partner: Not on file    Emotionally abused: Not on file    Physically abused: Not on file    Forced sexual activity: Not on file  Other Topics Concern   Not on file  Social History Narrative   Not on file    Review of Systems Per HPI.     Objective:   Physical Exam Vitals signs  reviewed.  Constitutional:      Appearance: She is well-developed.  HENT:     Head: Normocephalic and atraumatic.  Eyes:     Conjunctiva/sclera: Conjunctivae normal.     Pupils: Pupils are equal, round, and reactive to light.  Neck:     Vascular: No carotid bruit.  Cardiovascular:     Rate and Rhythm: Normal rate and regular rhythm.     Heart sounds: Normal heart sounds.  Pulmonary:     Effort: Pulmonary effort is normal.     Breath sounds: Normal breath sounds.  Abdominal:     Palpations: Abdomen is soft. There is no pulsatile mass.     Tenderness: There is abdominal tenderness in the suprapubic area and left lower quadrant. There is no right CVA tenderness, left CVA tenderness or guarding. Negative signs include Murphy's sign and McBurney's sign.  Skin:    General: Skin is warm and dry.  Neurological:     Mental Status: She is alert and oriented to person, place, and time.  Psychiatric:        Behavior: Behavior normal.    Vitals:   05/07/19 1153  BP: 133/83  Pulse: 84  Resp: 14  Temp: 98.7 F (37.1 C)  TempSrc: Oral  SpO2: 98%  Weight: 185 lb 3.2 oz (84 kg)   Results for orders placed or performed in visit on 05/07/19  POCT CBC  Result Value Ref Range   WBC 4.6 4.6 - 10.2 K/uL   Lymph, poc 2.2 0.6 - 3.4   POC LYMPH PERCENT 48.1 10 - 50 %L   MID (cbc) 0.1 0 - 0.9   POC MID % 3.1 0 - 12 %M   POC Granulocyte 2.2 2 - 6.9   Granulocyte percent 48.8 37 - 80 %G   RBC 4.39 4.04 - 5.48 M/uL   Hemoglobin 13.0 11 - 14.6 g/dL   HCT, POC 39.5 29 - 41 %   MCV 90.0 76 - 111 fL  MCH, POC 29.5 27 - 31.2 pg   MCHC 32.8 31.8 - 35.4 g/dL   RDW, POC 13.3 %   Platelet Count, POC 355 142 - 424 K/uL   MPV 7.0 0 - 99.8 fL  POCT Microscopic Urinalysis ([facility])  Result Value Ref Range   WBC,UR,HPF,POC Few (A) None WBC/hpf   RBC,UR,HPF,POC None None RBC/hpf   Bacteria None None, Too numerous to count   Mucus Absent Absent   Epithelial Cells, UR Per Microscopy Few (A) None, Too  numerous to count cells/hpf  POCT urinalysis dipstick  Result Value Ref Range   Color, UA yellow yellow   Clarity, UA clear clear   Glucose, UA negative negative mg/dL   Bilirubin, UA negative negative   Ketones, POC UA negative negative mg/dL   Spec Grav, UA >=1.030 (A) 1.010 - 1.025   Blood, UA trace-lysed (A) negative   pH, UA 6.0 5.0 - 8.0   Protein Ur, POC negative negative mg/dL   Urobilinogen, UA 0.2 0.2 or 1.0 E.U./dL   Nitrite, UA Negative Negative   Leukocytes, UA Negative Negative  POCT Wet + KOH Prep  Result Value Ref Range   Yeast by KOH Absent Absent   Yeast by wet prep Absent Absent   WBC by wet prep Few Few   Clue Cells Wet Prep HPF POC None None   Trich by wet prep Absent Absent   Bacteria Wet Prep HPF POC Few Few   Epithelial Cells By [employer] Pref ([facility]) Moderate (A) None, Few, Too numerous to count   RBC,UR,HPF,POC None None RBC/hpf       Assessment & Plan:   COURTENEY ALDERETE is a 23 y.o. female Lower abdominal pain - Plan: POCT CBC, POCT Microscopic Urinalysis ([facility]), POCT urinalysis dipstick, Ambulatory referral to Gastroenterology, Lipase  Vagina itching - Plan: POCT Wet + KOH Prep  Myalgia - Plan: POCT CBC  Recurrent abdominal pain.  Pelvic with abdominal pain recently next week this morning and abdominal pain.  Recent pelvic ultrasound without concerns, GC/CT cultures and wet prep negative, urine culture with normal as above.  Previous constipation, now with soft stools reported yesterday.  On further discussion may have a component of stress which she attributes to some abdominal discomfort and myalgias in the past.  Reassuring CBC, urinalysis in office today.  Afebrile.  -Referred to gastroenterology to determine other testing or imaging needed.  Deferred CT at this time but ER precautions given if any fevers, worsening abdominal pain or acute worsening of her symptoms.  Understanding expressed.   -Check lipase  -Handout given on abdominal pain and stress  with RTC/ER precautions No orders of the defined types were placed in this encounter.  Patient Instructions    Blood counts are normal today as well as your temperature.  Previous ultrasound looked okay.  Urine test and wet prep also reassuring.  I will refer you to a gastroenterologist to discuss abdominal pain, but if you feel that some your pain has been related to stress in the past, see information below on stress management.  If you do measure fevers, or any continued or worsening pain, I recommend emergency room evaluation to decide on other testing or imaging.   Return to the clinic or go to the nearest emergency room if any of your symptoms worsen or new symptoms occur.   Abdominal Pain, Adult Abdominal pain can be caused by many things. Often, abdominal pain is not serious and it gets better with no treatment or  by being treated at home. However, sometimes abdominal pain is serious. Your health care provider will do a medical history and a physical exam to try to determine the cause of your abdominal pain. Follow these instructions at home:  Take over-the-counter and prescription medicines only as told by your health care provider. Do not take a laxative unless told by your health care provider.  Drink enough fluid to keep your urine clear or pale yellow.  Watch your condition for any changes.  Keep all follow-up visits as told by your health care provider. This is important. Contact a health care provider if:  Your abdominal pain changes or gets worse.  You are not hungry or you lose weight without trying.  You are constipated or have diarrhea for more than 2-3 days.  You have pain when you urinate or have a bowel movement.  Your abdominal pain wakes you up at night.  Your pain gets worse with meals, after eating, or with certain foods.  You are throwing up and cannot keep anything down.  You have a fever. Get help right away if:  Your pain does not go away as soon  as your health care provider told you to expect.  You cannot stop throwing up.  Your pain is only in areas of the abdomen, such as the right side or the left lower portion of the abdomen.  You have bloody or black stools, or stools that look like tar.  You have severe pain, cramping, or bloating in your abdomen.  You have signs of dehydration, such as: ? Dark urine, very little urine, or no urine. ? Cracked lips. ? Dry mouth. ? Sunken eyes. ? Sleepiness. ? Weakness. This information is not intended to replace advice given to you by your health care provider. Make sure you discuss any questions you have with your health care provider. Document Released: 03/31/2005 Document Revised: 01/09/2016 Document Reviewed: 12/03/2015 Elsevier Interactive Patient Education  2020 [employer] is a normal reaction to life events. Stress is what you feel when life demands more than you are used to, or more than you think you can handle. Some stress can be useful, such as studying for a test or meeting a deadline at work. Stress that occurs too often or for too long can cause problems. It can affect your emotional health and interfere with relationships and normal daily activities. Too much stress can weaken your body's defense system (immune system) and increase your risk for physical illness. If you already have a medical problem, stress can make it worse. What are the causes? All sorts of life events can cause stress. An event that causes stress for one person may not be stressful for another person. Major life events, whether positive or negative, commonly cause stress. Examples include:  Losing a job or starting a new job.  Losing a loved one.  Moving to a new town or home.  Getting married or divorced.  Having a baby.  Injury or illness. Less obvious life events can also cause stress, especially if they occur day after day or in combination with each other. Examples  include:  Working long hours.  Driving in traffic.  Caring for children.  Being in debt.  Being in a difficult relationship. What are the signs or symptoms? Stress can cause emotional symptoms, including:  Anxiety. This is feeling worried, afraid, on edge, overwhelmed, or out of control.  Anger, including irritation or impatience.  Depression. This is  feeling sad, down, helpless, or guilty.  Trouble focusing, remembering, or making decisions. Stress can cause physical symptoms, including:  Aches and pains. These may affect your head, neck, back, stomach, or other areas of your body.  Tight muscles or a clenched jaw.  Low energy.  Trouble sleeping. Stress can cause unhealthy behaviors, including:  Eating to feel better (overeating) or skipping meals.  Working too much or putting off tasks.  Smoking, drinking alcohol, or using drugs to feel better. How is this diagnosed? Stress is diagnosed through an assessment by your health care provider. He or she may diagnose this condition based on:  Your symptoms and any stressful life events.  Your medical history.  Tests to rule out other causes of your symptoms. Depending on your condition, your health care provider may refer you to a specialist for further evaluation. How is this treated?  Stress management techniques are the recommended treatment for stress. Medicine is not typically recommended for the treatment of stress. Techniques to reduce your reaction to stressful life events include:  Stress identification. Monitor yourself for symptoms of stress and identify what causes stress for you. These skills may help you to avoid or prepare for stressful events.  Time management. Set your priorities, keep a calendar of events, and learn to say no. Taking these actions can help you avoid making too many commitments. Techniques for coping with stress include:  Rethinking the problem. Try to think realistically about  stressful events rather than ignoring them or overreacting. Try to find the positives in a stressful situation rather than focusing on the negatives.  Exercise. Physical exercise can release both physical and emotional tension. The key is to find a form of exercise that you enjoy and do it regularly.  Relaxation techniques. These relax the body and mind. The key is to find one or more that you enjoy and use the technique(s) regularly. Examples include: ? Meditation, deep breathing, or progressive relaxation techniques. ? Yoga or tai chi. ? Biofeedback, mindfulness techniques, or journaling. ? Listening to music, being out in nature, or participating in other hobbies.  Practicing a healthy lifestyle. Eat a balanced diet, drink plenty of water, limit or avoid caffeine, and get plenty of sleep.  Having a strong support network. Spend time with family, friends, or other people you enjoy being around. Express your feelings and talk things over with someone you trust. Counseling or talk therapy with a mental health professional may be helpful if you are having trouble managing stress on your own. Follow these instructions at home: Lifestyle   Avoid drugs.  Do not use any products that contain nicotine or tobacco, such as cigarettes and e-cigarettes. If you need help quitting, ask your health care provider.  Limit alcohol intake to no more than 1 drink a day for nonpregnant women and 2 drinks a day for men. One drink equals 12 oz of beer, 5 oz of wine, or 1 oz of hard liquor.  Do not use alcohol or drugs to relax.  Eat a balanced diet that includes fresh fruits and vegetables, whole grains, lean meats, fish, eggs, and beans, and low-fat dairy. Avoid processed foods and foods high in added fat, sugar, and salt.  Exercise at least 30 minutes on 5 or more days each week.  Get 7-8 hours of sleep each night. General instructions   Practice stress management techniques as discussed with your  health care provider.  Drink enough fluid to keep your urine clear or pale yellow.  Take over-the-counter and prescription medicines only as told by your health care provider.  Keep all follow-up visits as told by your health care provider. This is important. Contact a health care provider if:  Your symptoms get worse.  You have new symptoms.  You feel overwhelmed by your problems and can no longer manage them on your own. Get help right away if:  You have thoughts of hurting yourself or others. If you ever feel like you may hurt yourself or others, or have thoughts about taking your own life, get help right away. You can go to your nearest emergency department or call:  Your local emergency services (911 in the [place]  A suicide crisis helpline, such as the [place] at 585-020-5450. This is open 24 hours a day. Summary  Stress is a normal reaction to life events. It can cause problems if it happens too often or for too long.  Practicing stress management techniques is the best way to treat stress.  Counseling or talk therapy with a mental health professional may be helpful if you are having trouble managing stress on your own. This information is not intended to replace advice given to you by your health care provider. Make sure you discuss any questions you have with your health care provider. Document Released: 12/15/2000 Document Revised: 06/03/2017 Document Reviewed: 08/11/2016 Elsevier Patient Education  [employer].    If you have lab work done today you will be contacted with your lab results within the next 2 weeks.  If you have not heard from [place] then please contact us. The fastest way to get your results is to register for My Chart.   IF you received an x-ray today, you will receive an invoice from [facility]. Please contact [facility] at [phone] with questions or concerns regarding your invoice.   IF you  received labwork today, you will receive an invoice from [facility]. Please contact [facility] at [phone] with questions or concerns regarding your invoice.   Our billing staff will not be able to assist you with questions regarding bills from these companies.  You will be contacted with the lab results as soon as they are available. The fastest way to get your results is to activate your My Chart account. Instructions are located on the last page of this paperwork. If you have not heard from [place] regarding the results in 2 weeks, please contact this office.       Signed,   Merri Ray, MD [facility].  05/07/19 1:53 PM

## 2019-05-07 NOTE — Addendum Note (Signed)
Addended by: Merri Ray R on: 05/07/2019 01:58 PM   Modules accepted: Orders

## 2019-05-07 NOTE — Patient Instructions (Addendum)
Blood counts are normal today as well as your temperature.  Previous ultrasound looked okay.  Urine test and wet prep also reassuring.  I will refer you to a gastroenterologist to discuss abdominal pain, but if you feel that some your pain has been related to stress in the past, see information below on stress management.  If you do measure fevers, or any continued or worsening pain, I recommend emergency room evaluation to decide on other testing or imaging.   Return to the clinic or go to the nearest emergency room if any of your symptoms worsen or new symptoms occur.   Abdominal Pain, Adult Abdominal pain can be caused by many things. Often, abdominal pain is not serious and it gets better with no treatment or by being treated at home. However, sometimes abdominal pain is serious. Your health care provider will do a medical history and a physical exam to try to determine the cause of your abdominal pain. Follow these instructions at home:  Take over-the-counter and prescription medicines only as told by your health care provider. Do not take a laxative unless told by your health care provider.  Drink enough fluid to keep your urine clear or pale yellow.  Watch your condition for any changes.  Keep all follow-up visits as told by your health care provider. This is important. Contact a health care provider if:  Your abdominal pain changes or gets worse.  You are not hungry or you lose weight without trying.  You are constipated or have diarrhea for more than 2-3 days.  You have pain when you urinate or have a bowel movement.  Your abdominal pain wakes you up at night.  Your pain gets worse with meals, after eating, or with certain foods.  You are throwing up and cannot keep anything down.  You have a fever. Get help right away if:  Your pain does not go away as soon as your health care provider told you to expect.  You cannot stop throwing up.  Your pain is only in areas of  the abdomen, such as the right side or the left lower portion of the abdomen.  You have bloody or black stools, or stools that look like tar.  You have severe pain, cramping, or bloating in your abdomen.  You have signs of dehydration, such as: ? Dark urine, very little urine, or no urine. ? Cracked lips. ? Dry mouth. ? Sunken eyes. ? Sleepiness. ? Weakness. This information is not intended to replace advice given to you by your health care provider. Make sure you discuss any questions you have with your health care provider. Document Released: 03/31/2005 Document Revised: 01/09/2016 Document Reviewed: 12/03/2015 Elsevier Interactive Patient Education  2020 [employer]   Stress Stress is a normal reaction to life events. Stress is what you feel when life demands more than you are used to, or more than you think you can handle. Some stress can be useful, such as studying for a test or meeting a deadline at work. Stress that occurs too often or for too long can cause problems. It can affect your emotional health and interfere with relationships and normal daily activities. Too much stress can weaken your body's defense system (immune system) and increase your risk for physical illness. If you already have a medical problem, stress can make it worse. What are the causes? All sorts of life events can cause stress. An event that causes stress for one person may not be stressful for   another person. Major life events, whether positive or negative, commonly cause stress. Examples include:  Losing a job or starting a new job.  Losing a loved one.  Moving to a new town or home.  Getting married or divorced.  Having a baby.  Injury or illness. Less obvious life events can also cause stress, especially if they occur day after day or in combination with each other. Examples include:  Working long hours.  Driving in traffic.  Caring for children.  Being in debt.  Being in a difficult  relationship. What are the signs or symptoms? Stress can cause emotional symptoms, including:  Anxiety. This is feeling worried, afraid, on edge, overwhelmed, or out of control.  Anger, including irritation or impatience.  Depression. This is feeling sad, down, helpless, or guilty.  Trouble focusing, remembering, or making decisions. Stress can cause physical symptoms, including:  Aches and pains. These may affect your head, neck, back, stomach, or other areas of your body.  Tight muscles or a clenched jaw.  Low energy.  Trouble sleeping. Stress can cause unhealthy behaviors, including:  Eating to feel better (overeating) or skipping meals.  Working too much or putting off tasks.  Smoking, drinking alcohol, or using drugs to feel better. How is this diagnosed? Stress is diagnosed through an assessment by your health care provider. He or she may diagnose this condition based on:  Your symptoms and any stressful life events.  Your medical history.  Tests to rule out other causes of your symptoms. Depending on your condition, your health care provider may refer you to a specialist for further evaluation. How is this treated?  Stress management techniques are the recommended treatment for stress. Medicine is not typically recommended for the treatment of stress. Techniques to reduce your reaction to stressful life events include:  Stress identification. Monitor yourself for symptoms of stress and identify what causes stress for you. These skills may help you to avoid or prepare for stressful events.  Time management. Set your priorities, keep a calendar of events, and learn to say "no." Taking these actions can help you avoid making too many commitments. Techniques for coping with stress include:  Rethinking the problem. Try to think realistically about stressful events rather than ignoring them or overreacting. Try to find the positives in a stressful situation rather than  focusing on the negatives.  Exercise. Physical exercise can release both physical and emotional tension. The key is to find a form of exercise that you enjoy and do it regularly.  Relaxation techniques. These relax the body and mind. The key is to find one or more that you enjoy and use the technique(s) regularly. Examples include: ? Meditation, deep breathing, or progressive relaxation techniques. ? Yoga or tai chi. ? Biofeedback, mindfulness techniques, or journaling. ? Listening to music, being out in nature, or participating in other hobbies.  Practicing a healthy lifestyle. Eat a balanced diet, drink plenty of water, limit or avoid caffeine, and get plenty of sleep.  Having a strong support network. Spend time with family, friends, or other people you enjoy being around. Express your feelings and talk things over with someone you trust. Counseling or talk therapy with a mental health professional may be helpful if you are having trouble managing stress on your own. Follow these instructions at home: Lifestyle   Avoid drugs.  Do not use any products that contain nicotine or tobacco, such as cigarettes and e-cigarettes. If you need help quitting, ask your health care provider.    Limit alcohol intake to no more than 1 drink a day for nonpregnant women and 2 drinks a day for men. One drink equals 12 oz of beer, 5 oz of wine, or 1 oz of hard liquor.  Do not use alcohol or drugs to relax.  Eat a balanced diet that includes fresh fruits and vegetables, whole grains, lean meats, fish, eggs, and beans, and low-fat dairy. Avoid processed foods and foods high in added fat, sugar, and salt.  Exercise at least 30 minutes on 5 or more days each week.  Get 7-8 hours of sleep each night. General instructions   Practice stress management techniques as discussed with your health care provider.  Drink enough fluid to keep your urine clear or pale yellow.  Take over-the-counter and  prescription medicines only as told by your health care provider.  Keep all follow-up visits as told by your health care provider. This is important. Contact a health care provider if:  Your symptoms get worse.  You have new symptoms.  You feel overwhelmed by your problems and can no longer manage them on your own. Get help right away if:  You have thoughts of hurting yourself or others. If you ever feel like you may hurt yourself or others, or have thoughts about taking your own life, get help right away. You can go to your nearest emergency department or call:  Your local emergency services (911 in the [place]  A suicide crisis helpline, such as the [place] at 210-869-0381. This is open 24 hours a day. Summary  Stress is a normal reaction to life events. It can cause problems if it happens too often or for too long.  Practicing stress management techniques is the best way to treat stress.  Counseling or talk therapy with a mental health professional may be helpful if you are having trouble managing stress on your own. This information is not intended to replace advice given to you by your health care provider. Make sure you discuss any questions you have with your health care provider. Document Released: 12/15/2000 Document Revised: 06/03/2017 Document Reviewed: 08/11/2016 Elsevier Patient Education  [employer].    If you have lab work done today you will be contacted with your lab results within the next 2 weeks.  If you have not heard from [place] then please contact us. The fastest way to get your results is to register for My Chart.   IF you received an x-ray today, you will receive an invoice from [facility]. Please contact [facility] at [phone] with questions or concerns regarding your invoice.   IF you received labwork today, you will receive an invoice from [facility]. Please contact [facility] at [phone] with  questions or concerns regarding your invoice.   Our billing staff will not be able to assist you with questions regarding bills from these companies.  You will be contacted with the lab results as soon as they are available. The fastest way to get your results is to activate your My Chart account. Instructions are located on the last page of this paperwork. If you have not heard from [place] regarding the results in 2 weeks, please contact this office.

## 2019-05-08 LAB — LIPASE: Lipase: 48 U/L (ref 14–72)

## 2019-06-06 ENCOUNTER — Encounter: Payer: Self-pay | Admitting: Internal Medicine

## 2019-06-21 ENCOUNTER — Other Ambulatory Visit: Payer: Self-pay

## 2019-06-21 ENCOUNTER — Encounter: Payer: Self-pay | Admitting: Adult Health Nurse Practitioner

## 2019-06-21 ENCOUNTER — Ambulatory Visit (INDEPENDENT_AMBULATORY_CARE_PROVIDER_SITE_OTHER): Payer: Managed Care, Other (non HMO) | Admitting: Adult Health Nurse Practitioner

## 2019-06-21 VITALS — BP 131/83 | HR 75 | Temp 98.4°F | Resp 12 | Ht 65.5 in | Wt 186.0 lb

## 2019-06-21 DIAGNOSIS — Z0000 Encounter for general adult medical examination without abnormal findings: Secondary | ICD-10-CM | POA: Insufficient documentation

## 2019-06-21 NOTE — Progress Notes (Signed)
  Chief Complaint  Patient presents with  . Annual Exam    HPI   Patient is here for annual exam and completion of paperwork.  She has had some back pain that seems to be triggered by drinking sodas.  Noted when she stopped drinking and drank more water, her back felt better.  Noted that after she had 2 cups of coffee.  Last visit she did have some dehydration so it's possible that soda could be irritating to her kidneys.    Problem List    Problem List: 2017-07: Thyromegaly   Allergies   has No Known Allergies.  Medications   No current outpatient medications on file.   Review of Systems    Constitutional: Negative for activity change, appetite change, chills and fever.  HENT: Negative for congestion, nosebleeds, trouble swallowing and voice change.   Respiratory: Negative for cough, shortness of breath and wheezing.   Gastrointestinal: Negative for diarrhea, nausea and vomiting.  Genitourinary: Negative for difficulty urinating, dysuria, flank pain and hematuria.  Musculoskeletal: Negative for back pain, joint swelling and neck pain.  Neurological: Negative for dizziness, speech difficulty, light-headedness and numbness.  See HPI. All other review of systems negative.     Physical Exam:   Physical Examination: General appearance - alert, well appearing, and in no distress and oriented to person, place, and time Mental status - normal mood, behavior, speech, dress, motor activity, and thought processes Eyes - not examined, Visually impaired -Peter's anomaly Neck - supple, no significant adenopathy, carotids upstroke normal bilaterally, no bruits, thyroid exam: thyroid is normal in size without nodules or tenderness Chest - clear to auscultation, no wheezes, rales or rhonchi, symmetric air entry  Heart - normal rate, regular rhythm, normal S1, S2, no murmurs, rubs, clicks or gallops Extremities - dependent LE edema without clubbing or cyanosis Skin - normal coloration and  turgor, no rashes, no suspicious skin lesions noted  No hyperpigmentation of skin.  No current hematomas noted   Lab Review   labs are reviewed, up to date and normal Would check thyroid when patient gets insurance. .   Assessment & Plan:   1. Annual physical exam    Forms were completed for patient.  Encouraged to return for blood work when job gives her insurance.  She is inline with this plan.   Glyn Ade, NP

## 2019-09-14 ENCOUNTER — Encounter: Payer: Self-pay | Admitting: Adult Health Nurse Practitioner

## 2019-09-14 ENCOUNTER — Other Ambulatory Visit: Payer: Self-pay

## 2019-09-14 ENCOUNTER — Ambulatory Visit (INDEPENDENT_AMBULATORY_CARE_PROVIDER_SITE_OTHER): Payer: BC Managed Care – PPO | Admitting: Adult Health Nurse Practitioner

## 2019-09-14 ENCOUNTER — Other Ambulatory Visit (HOSPITAL_COMMUNITY)
Admission: RE | Admit: 2019-09-14 | Discharge: 2019-09-14 | Disposition: A | Discharge status: Home / Self Care | Payer: Managed Care, Other (non HMO) | Source: Ambulatory Visit | Attending: Adult Health Nurse Practitioner | Admitting: Adult Health Nurse Practitioner

## 2019-09-14 VITALS — BP 133/82 | HR 88 | Temp 98.2°F | Ht 65.0 in | Wt 189.0 lb

## 2019-09-14 DIAGNOSIS — Z3009 Encounter for other general counseling and advice on contraception: Secondary | ICD-10-CM | POA: Insufficient documentation

## 2019-09-14 DIAGNOSIS — N898 Other specified noninflammatory disorders of vagina: Secondary | ICD-10-CM

## 2019-09-14 DIAGNOSIS — R8290 Unspecified abnormal findings in urine: Secondary | ICD-10-CM | POA: Diagnosis not present

## 2019-09-14 LAB — POCT WET + KOH PREP
Trich by wet prep: ABSENT
Yeast by KOH: ABSENT
Yeast by wet prep: ABSENT

## 2019-09-14 LAB — POCT URINALYSIS DIP (MANUAL ENTRY)
Bilirubin, UA: NEGATIVE
Glucose, UA: NEGATIVE mg/dL
Ketones, POC UA: NEGATIVE mg/dL
Leukocytes, UA: NEGATIVE
Nitrite, UA: NEGATIVE
Protein Ur, POC: 30 mg/dL — AB
Spec Grav, UA: >=1.03 — AB (ref 1.010–1.025)
Urobilinogen, UA: 0.2 E.U./dL
pH, UA: 6 (ref 5.0–8.0)

## 2019-09-14 LAB — CMP14+EGFR
ALT: 18 IU/L (ref 0–32)
AST: 23 IU/L (ref 0–40)
Albumin/Globulin Ratio: 1.7 (ref 1.2–2.2)
Albumin: 4.5 g/dL (ref 3.9–5.0)
Alkaline Phosphatase: 65 IU/L (ref 39–117)
BUN/Creatinine Ratio: 8 — ABNORMAL LOW (ref 9–23)
BUN: 7 mg/dL (ref 6–20)
Bilirubin Total: <0.2 mg/dL (ref 0.0–1.2)
CO2: 17 mmol/L — ABNORMAL LOW (ref 20–29)
Calcium: 9.8 mg/dL (ref 8.7–10.2)
Chloride: 106 mmol/L (ref 96–106)
Creatinine, Ser: 0.9 mg/dL (ref 0.57–1.00)
GFR calc Af Amer: 104 mL/min/{1.73_m2} (ref >59)
GFR calc non Af Amer: 90 mL/min/{1.73_m2} (ref >59)
Globulin, Total: 2.7 g/dL (ref 1.5–4.5)
Glucose: 132 mg/dL — ABNORMAL HIGH (ref 65–99)
Potassium: 4.1 mmol/L (ref 3.5–5.2)
Sodium: 138 mmol/L (ref 134–144)
Total Protein: 7.2 g/dL (ref 6.0–8.5)

## 2019-09-14 LAB — POCT URINE PREGNANCY: Preg Test, Ur: NEGATIVE

## 2019-09-14 MED ORDER — MEDROXYPROGESTERONE ACETATE 150 MG/ML IM SUSP
150.0000 mg | Freq: Once | INTRAMUSCULAR | Status: AC
  Administered 2019-09-14: 150 mg via INTRAMUSCULAR

## 2019-09-14 NOTE — Progress Notes (Signed)
  Chief Complaint  Patient presents with  . Vaginal Discharge    Pt stated that she has been havig vaginal discharge for the past month. There is a light smell but vaginal/pelvic pain, with brown discharge that looks thick like cottage cheese. Pt stated that she is not having sex.  . Depo injection    HPI   Patient presents for vaginal discharge has been going on for the past few days.  Denies any odor.  Denies itching or discomfort.  She had 2 episodes of vaginal pain when she was sitting down that felt like a dull ache.  She is currently not having sex and has not been exposed to any STDs.  She would like to also start Depo today.  Patient seems mostly concerned about the consistency of her discharge and brings in pictures.  Of note that the discharge is not associated with any unusual or uncomfortable symptom.  She has had BV once in the past back in 2019.  Problem List    Problem List: 2020-12: Annual physical exam 2017-07: Thyromegaly   Allergies   has No Known Allergies.  Medications   No current outpatient medications on file.  Current Facility-Administered Medications:  .  medroxyPROGESTERone (DEPO-PROVERA) injection 150 mg, 150 mg, Intramuscular, Once, Colette Ribas, Lonna Cobb, NP   Review of Systems    Review of Systems See HPI Constitution: No fevers or chills No malaise No diaphoresis Skin: No rash or itching Eyes: no blurry vision, no double vision GU: no dysuria or hematuria, + for vaginal discharge. Negative for itching, malodorous discharge, pain, or discomfort  Neuro: no dizziness or headaches    Physical Exam:    height is 5\' 5"  (1.651 m) and weight is 189 lb (85.7 kg). Her temporal temperature is 98.2 F (36.8 C). Her blood pressure is 133/82 and her pulse is 88. Her oxygen saturation is 98%.    GEN: WDWN, NAD, Non-toxic, Alert & Oriented x 3 HEENT: Atraumatic, Normocephalic.  Ears and Nose: No external deformity. EXTR: No  clubbing/cyanosis/edema NEURO: Normal gait.  PSYCH: Normally interactive. Conversant. Not depressed or anxious appearing.  Calm demeanor.    Lab Review    Previous labs were normal.  A pregnancy test today negative UA did show trace blood and protein = 30.  Wet prep was negative for BV, yeast, trichomonas. Assessment & Plan:  Jasmine Hurley is a 24 y.o. female    1. Vaginal discharge   2. Encounter for counseling regarding initiation of other contraceptive measure   3. Abnormal urinalysis    Orders Placed This Encounter  Procedures  . POCT Wet + KOH Prep  . POCT urinalysis dipstick  . POCT urine pregnancy   Patient's has no evidence of vaginal urinary infection.  We did spend some time talking about hormonal fluctuations.  She had been off the Depo in her.  Did not come back until February.  Also, we discussed hygiene and she does use soap to clean her vagina regularly.  We discussed minimizing the amount of soap and the frequency in which she uses that.  Likely throwing off PAH and causing transient symptoms.   For the protein in her urine, we discussed her diet in full and like she might have protein in her urine.  We will check a CMP today.  Encouraged to drink water that she says she drinks quite a bit a day.  Eats 2 meals a day and works nights.   [place], NP

## 2019-09-21 LAB — CERVICOVAGINAL ANCILLARY ONLY
Bacterial Vaginitis-Urine: POSITIVE — AB
Bacterial Vaginitis-Urine: POSITIVE — AB
Chlamydia: NEGATIVE
Comment: NEGATIVE
Comment: NEGATIVE
Comment: NORMAL
Neisseria Gonorrhea: NEGATIVE
Trichomonas: NEGATIVE

## 2019-10-02 ENCOUNTER — Other Ambulatory Visit: Payer: Self-pay

## 2019-10-02 ENCOUNTER — Ambulatory Visit (INDEPENDENT_AMBULATORY_CARE_PROVIDER_SITE_OTHER): Payer: Managed Care, Other (non HMO) | Admitting: Adult Health Nurse Practitioner

## 2019-10-02 VITALS — BP 108/76 | HR 92 | Temp 98.2°F | Ht 65.0 in | Wt 188.8 lb

## 2019-10-02 DIAGNOSIS — F5102 Adjustment insomnia: Secondary | ICD-10-CM | POA: Diagnosis not present

## 2019-10-02 DIAGNOSIS — E010 Iodine-deficiency related diffuse (endemic) goiter: Secondary | ICD-10-CM | POA: Diagnosis not present

## 2019-10-02 DIAGNOSIS — N898 Other specified noninflammatory disorders of vagina: Secondary | ICD-10-CM | POA: Diagnosis not present

## 2019-10-02 DIAGNOSIS — I898 Other specified noninfective disorders of lymphatic vessels and lymph nodes: Secondary | ICD-10-CM | POA: Diagnosis not present

## 2019-10-02 DIAGNOSIS — N760 Acute vaginitis: Secondary | ICD-10-CM

## 2019-10-02 DIAGNOSIS — B9689 Other specified bacterial agents as the cause of diseases classified elsewhere: Secondary | ICD-10-CM

## 2019-10-02 LAB — POCT URINALYSIS DIP (MANUAL ENTRY)
Bilirubin, UA: NEGATIVE
Glucose, UA: NEGATIVE mg/dL
Ketones, POC UA: NEGATIVE mg/dL
Leukocytes, UA: NEGATIVE
Nitrite, UA: NEGATIVE
Protein Ur, POC: NEGATIVE mg/dL
Spec Grav, UA: 1.025 (ref 1.010–1.025)
Urobilinogen, UA: 0.2 E.U./dL
pH, UA: 7 (ref 5.0–8.0)

## 2019-10-02 LAB — POCT WET + KOH PREP
Trich by wet prep: ABSENT
Yeast by KOH: ABSENT
Yeast by wet prep: ABSENT

## 2019-10-02 MED ORDER — TINIDAZOLE 500 MG PO TABS: 2.0000 g | ORAL_TABLET | Freq: Every day | ORAL | 0 refills | Status: AC

## 2019-10-02 MED ORDER — SULFAMETHOXAZOLE-TRIMETHOPRIM 800-160 MG PO TABS: 1.0000 | ORAL_TABLET | Freq: Two times a day (BID) | ORAL | 0 refills | Status: AC

## 2019-10-02 MED ORDER — TRAZODONE HCL 50 MG PO TABS: 25.0000 mg | ORAL_TABLET | Freq: Every evening | ORAL | 3 refills | Status: DC | PRN

## 2019-10-02 NOTE — Patient Instructions (Signed)
° ° ° °  If you have lab work done today you will be contacted with your lab results within the next 2 weeks.  If you have not heard from us then please contact us. The fastest way to get your results is to register for My Chart. ° ° °IF you received an x-ray today, you will receive an invoice from [facility]. Please contact [facility] at [phone] with questions or concerns regarding your invoice.  ° °IF you received labwork today, you will receive an invoice from [facility]. Please contact [facility] at [phone] with questions or concerns regarding your invoice.  ° °Our billing staff will not be able to assist you with questions regarding bills from these companies. ° °You will be contacted with the lab results as soon as they are available. The fastest way to get your results is to activate your My Chart account. Instructions are located on the last page of this paperwork. If you have not heard from us regarding the results in 2 weeks, please contact this office. °  ° ° ° °

## 2019-10-03 LAB — CBC WITH DIFFERENTIAL/PLATELET
Basophils Absolute: 0 10*3/uL (ref 0.0–0.2)
Basos: 0 %
EOS (ABSOLUTE): 0.3 10*3/uL (ref 0.0–0.4)
Eos: 6 %
Hematocrit: 40 % (ref 34.0–46.6)
Hemoglobin: 13.3 g/dL (ref 11.1–15.9)
Immature Grans (Abs): 0 10*3/uL (ref 0.0–0.1)
Immature Granulocytes: 0 %
Lymphocytes Absolute: 1.8 10*3/uL (ref 0.7–3.1)
Lymphs: 34 %
MCH: 30.2 pg (ref 26.6–33.0)
MCHC: 33.3 g/dL (ref 31.5–35.7)
MCV: 91 fL (ref 79–97)
Monocytes Absolute: 0.5 10*3/uL (ref 0.1–0.9)
Monocytes: 10 %
Neutrophils Absolute: 2.7 10*3/uL (ref 1.4–7.0)
Neutrophils: 50 %
Platelets: 346 10*3/uL (ref 150–450)
RBC: 4.41 x10E6/uL (ref 3.77–5.28)
RDW: 12.4 % (ref 11.7–15.4)
WBC: 5.4 10*3/uL (ref 3.4–10.8)

## 2019-10-03 LAB — URINE CULTURE

## 2019-10-03 LAB — HEMOGLOBIN A1C
Est. average glucose Bld gHb Est-mCnc: 117 mg/dL
Hgb A1c MFr Bld: 5.7 % — ABNORMAL HIGH (ref 4.8–5.6)

## 2019-10-03 LAB — THYROID PANEL WITH TSH
Free Thyroxine Index: 2.1 (ref 1.2–4.9)
T3 Uptake Ratio: 27 % (ref 24–39)
T4, Total: 7.9 ug/dL (ref 4.5–12.0)
TSH: 2.55 u[IU]/mL (ref 0.450–4.500)

## 2019-10-30 ENCOUNTER — Encounter: Payer: Self-pay | Admitting: Adult Health Nurse Practitioner

## 2019-10-30 DIAGNOSIS — B9689 Other specified bacterial agents as the cause of diseases classified elsewhere: Secondary | ICD-10-CM | POA: Insufficient documentation

## 2019-10-30 DIAGNOSIS — F5102 Adjustment insomnia: Secondary | ICD-10-CM | POA: Insufficient documentation

## 2019-10-30 DIAGNOSIS — N760 Acute vaginitis: Secondary | ICD-10-CM | POA: Insufficient documentation

## 2019-10-30 NOTE — Progress Notes (Signed)
   10/30/2019  Jasmine Hurley [birth year] [id number]    SUBJECTIVE:    Patient returns with a number of symptoms as listed below:   1. Vaginal Discharge  24 y.o. female complains of clear and copious vaginal discharge for  week(s). Denies abnormal vaginal bleeding or significant pelvic pain or fever. No UTI symptoms. Denies history of known exposure to STD.  --also complains of a small bump in inguinal area, vaginally.  Movable, non-fixed.  Non-draining and intermittently tender.   2.  Hx of thyroid problems in family.  Needs to be evaluated.  Some fatigue.  No sensitivity to cold or hot temperatures, no hair loss or depression.   3.  Insomnia:  Patient is having difficulty with sleeping.  Not getting refreshed sleep.  Feels that she cannot go to sleep because has much on her mind and feels that when she wakes in the orning, she is not well rested.  Has tried OTC medications which have resulted in grogginess. Interested in discussing meds for sleep.       No LMP recorded. Patient has had an injection.  OBJECTIVE:  General appearance: alert, well appearing, and in no distress and oriented to person, place, and time. Mental Status: normal mood, behavior, speech, dress, motor activity, and thought processes. Thyroid exam reveals thyroid enlarged, smooth, nontender, no nodules. She appears well, afebrile. Abdomen: benign, soft, nontender, no masses. Pelvic Exam: normal external genitalia, vulva, vagina, cervix, uterus and adnexa, examination not indicated. Lymphadenopathy noted unilaterally inguinal right.  Urine dipstick: negative for all components.  ASSESSMENT:  1. Vaginal discharge   2. Thyromegaly   3. Inguinal lymphocyst   4. Adjustment insomnia   5. Bacterial vaginosis    Meds ordered this encounter  Medications  . tinidazole (TINDAMAX) 500 MG tablet    Sig: Take 4 tablets (2,000 mg total) by mouth daily with breakfast for 4 doses.    Dispense:  4 tablet    Refill:   0  . sulfamethoxazole-trimethoprim (BACTRIM DS) 800-160 MG tablet    Sig: Take 1 tablet by mouth 2 (two) times daily for 5 days.    Dispense:  10 tablet    Refill:  0  . traZODone (DESYREL) 50 MG tablet    Sig: Take 0.5-1 tablets (25-50 mg total) by mouth at bedtime as needed for sleep.    Dispense:  30 tablet    Refill:  3   Orders Placed This Encounter  Procedures  . Urine Culture  . Hemoglobin A1c  . Thyroid Panel With TSH  . CBC with Differential/Platelet  . CMP14+EGFR  . CBC with Differential/Platelet  . POCT urinalysis dipstick  . POCT Wet + KOH Prep     PLAN:  GC and chlamydia DNA  probe sent to lab. Treatment: abstain from coitus during course of treatment and see orders for additional treatments ROV prn if symptoms persist or worsen.  Patient to call or f/u if symptoms persist or worsen.  Reviewed education regarding BV and lymphocyst as well as the difference.  Given elevated BS noted in chart, would further evaluate to determine there is no hyperglycemia affecting bacterial or yeast overgrowth in the vaginal area.   She is inline with this plan.    Glyn Ade, NP

## 2020-01-09 ENCOUNTER — Ambulatory Visit: Payer: Medicaid Other

## 2020-01-10 ENCOUNTER — Ambulatory Visit: Payer: Medicaid Other | Admitting: Family Medicine

## 2020-01-10 ENCOUNTER — Ambulatory Visit (INDEPENDENT_AMBULATORY_CARE_PROVIDER_SITE_OTHER): Payer: Managed Care, Other (non HMO) | Admitting: Family Medicine

## 2020-01-10 ENCOUNTER — Encounter: Payer: Self-pay | Admitting: Family Medicine

## 2020-01-10 ENCOUNTER — Other Ambulatory Visit: Payer: Self-pay

## 2020-01-10 VITALS — BP 119/81 | HR 77 | Temp 97.9°F | Ht 65.0 in | Wt 189.2 lb

## 2020-01-10 DIAGNOSIS — Z3042 Encounter for surveillance of injectable contraceptive: Secondary | ICD-10-CM

## 2020-01-10 DIAGNOSIS — N898 Other specified noninflammatory disorders of vagina: Secondary | ICD-10-CM | POA: Diagnosis not present

## 2020-01-10 DIAGNOSIS — B9689 Other specified bacterial agents as the cause of diseases classified elsewhere: Secondary | ICD-10-CM

## 2020-01-10 DIAGNOSIS — N912 Amenorrhea, unspecified: Secondary | ICD-10-CM | POA: Diagnosis not present

## 2020-01-10 DIAGNOSIS — K5900 Constipation, unspecified: Secondary | ICD-10-CM

## 2020-01-10 DIAGNOSIS — N760 Acute vaginitis: Secondary | ICD-10-CM

## 2020-01-10 DIAGNOSIS — J020 Streptococcal pharyngitis: Secondary | ICD-10-CM | POA: Diagnosis not present

## 2020-01-10 DIAGNOSIS — R1032 Left lower quadrant pain: Secondary | ICD-10-CM

## 2020-01-10 LAB — POCT URINALYSIS DIP (MANUAL ENTRY)
Bilirubin, UA: NEGATIVE
Glucose, UA: NEGATIVE mg/dL
Ketones, POC UA: NEGATIVE mg/dL
Nitrite, UA: NEGATIVE
Protein Ur, POC: NEGATIVE mg/dL
Spec Grav, UA: >=1.03 — AB (ref 1.010–1.025)
Urobilinogen, UA: 0.2 E.U./dL
pH, UA: 5.5 (ref 5.0–8.0)

## 2020-01-10 LAB — POCT WET + KOH PREP
Trich by wet prep: ABSENT
Yeast by KOH: ABSENT
Yeast by wet prep: ABSENT

## 2020-01-10 LAB — POCT URINE PREGNANCY: Preg Test, Ur: NEGATIVE

## 2020-01-10 MED ORDER — METRONIDAZOLE 500 MG PO TABS: 500.0000 mg | ORAL_TABLET | Freq: Two times a day (BID) | ORAL | 0 refills | Status: DC

## 2020-01-10 MED ORDER — MEDROXYPROGESTERONE ACETATE 150 MG/ML IM SUSP
150.0000 mg | Freq: Once | INTRAMUSCULAR | Status: AC
  Administered 2020-01-10: 150 mg via INTRAMUSCULAR

## 2020-01-10 NOTE — Patient Instructions (Addendum)
  The wet prep shows you do have bacterial vaginosis again causing your discharge.  I do not know why you get it recurrently.  If you keep having problems from this, it would probably be good to see a gynecologist to discuss possible preventive methods.  I did not do a pelvic exam on you, but on abdominal exam I felt like your left low abdomen pain was more in the area of the sigmoid colon which is fairly common in people who get a little high constipation.  I recommend that when you have these problems you take a mild laxative such as MiraLAX or Dulcolax which you can purchase over-the-counter.  Drink plenty of fluids always.  Try to increase the fiber in your diet by eating more fruits and vegetables.  If the pain is getting worse please return for a recheck, or for acute abdominal pain go to the emergency room if needed.  You will be started back on your Depo-Provera for controlling of your menstrual cycles.  If you are depending on this for contraception, you should wait a couple of weeks or use condoms because there is a chance of breakthrough ovulation in someone who is missed a shot for a month.  Your throat looks fine.  Try taking an antihistamine such as Allegra or Claritin.  Make sure you get your next Depo-Provera shot in 3 months which would be about October 8.    If you have lab work done today you will be contacted with your lab results within the next 2 weeks.  If you have not heard from [place] then please contact us. The fastest way to get your results is to register for My Chart.   IF you received an x-ray today, you will receive an invoice from [facility]. Please contact [facility] at [phone] with questions or concerns regarding your invoice.   IF you received labwork today, you will receive an invoice from [facility]. Please contact [facility] at [phone] with questions or concerns regarding your invoice.   Our billing staff will not be able to assist you  with questions regarding bills from these companies.  You will be contacted with the lab results as soon as they are available. The fastest way to get your results is to activate your My Chart account. Instructions are located on the last page of this paperwork. If you have not heard from [place] regarding the results in 2 weeks, please contact this office.

## 2020-01-10 NOTE — Progress Notes (Signed)
Patient ID: Jasmine Hurley, female    DOB: 1996/04/03  Age: 24 y.o. MRN: [id number]  Chief Complaint  Patient presents with  . Pelvic Pain    Pt stated that she has been experiencing some pelvic pain along with some green discharge for the past month. There is no smell or itching in the vaginal area. She is also needing birth control injection     Subjective:   Patient has been having several problems that she comes in here today for.  She is a month late for her Depo-Provera shot and would like that.  She primarily uses it not for contraception says she has not been sexually involved for couple of years, but uses it for shutting off her menses.  That is worked well for her.  She has a birthday and some traveling coming up and does not want to have periods.  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  She has been having some problem with pain in the left lower quadrant of the abdomen intermittently.  It bothered her for about 3 days.  A little tender today.  She has a little sore throat today she would like me to check.  She has vaginal discharge consistent with BV that she has had recurrently.  Current allergies, medications, problem list, past/family and social histories reviewed.  Objective:  BP 119/81   Pulse 77   Temp 97.9 F (36.6 C) (Temporal)   Ht 5\' 5"  (1.651 m)   Wt 189 lb 3.2 oz (85.8 kg)   LMP  (LMP Unknown)   SpO2 96%   BMI 31.48 kg/m   Throat clear, not erythematous.  No nodes in neck.  Abdomen soft without organomegaly or masses.  In the lateral left lower quadrant there is a little mild tenderness.  This seems to be over the sigmoid area.  Assessment & Plan:   Assessment: 1. Vaginal discharge   2. BV (bacterial vaginosis)   3. Amenorrhea due to Depo Provera   4. Streptococcal sore throat   5. Abdominal pain, LLQ   6. Constipation, unspecified constipation type       Plan: Try to take a little laxative when she hurts in the left lower  quadrant.  If it continues to persist will need another pelvic exam.  Continue using the Depo-Provera for contraception and menses control.  Reassurance about atrophic.  Orders Placed This Encounter  Procedures  . POCT urinalysis dipstick  . POCT Wet + KOH Prep  . POCT urine pregnancy    No orders of the defined types were placed in this encounter.        Patient Instructions    The wet prep shows you do have bacterial vaginosis again causing your discharge.  I do not know why you get it recurrently.  If you keep having problems from this, it would probably be good to see a gynecologist to discuss possible preventive methods.  I did not do a pelvic exam on you, but on abdominal exam I felt like your left low abdomen pain was more in the area of the sigmoid colon which is fairly common in people who get a little high constipation.  I recommend that when you have these problems you take a mild laxative such as MiraLAX or Dulcolax which you can purchase over-the-counter.  Drink plenty of fluids always.  Try to increase the fiber in your diet by eating more fruits and vegetables.  If the pain is getting  worse please return for a recheck, or for acute abdominal pain go to the emergency room if needed.  You will be started back on your Depo-Provera for controlling of your menstrual cycles.  If you are depending on this for contraception, you should wait a couple of weeks or use condoms because there is a chance of breakthrough ovulation in someone who is missed a shot for a month.  Your throat looks fine.  Try taking an antihistamine such as Allegra or Claritin.  Make sure you get your next Depo-Provera shot in 3 months which would be about October 8.    If you have lab work done today you will be contacted with your lab results within the next 2 weeks.  If you have not heard from [place] then please contact us. The fastest way to get your results is to register for My Chart.   IF you  received an x-ray today, you will receive an invoice from [facility]. Please contact [facility] at [phone] with questions or concerns regarding your invoice.   IF you received labwork today, you will receive an invoice from [facility]. Please contact [facility] at [phone] with questions or concerns regarding your invoice.   Our billing staff will not be able to assist you with questions regarding bills from these companies.  You will be contacted with the lab results as soon as they are available. The fastest way to get your results is to activate your My Chart account. Instructions are located on the last page of this paperwork. If you have not heard from [place] regarding the results in 2 weeks, please contact this office.        Return if symptoms worsen or fail to improve.   Janace Hoard, MD 01/10/2020

## 2020-04-17 ENCOUNTER — Ambulatory Visit: Payer: Managed Care, Other (non HMO)

## 2020-04-17 ENCOUNTER — Ambulatory Visit: Payer: Managed Care, Other (non HMO) | Attending: Critical Care Medicine

## 2020-04-17 DIAGNOSIS — Z23 Encounter for immunization: Secondary | ICD-10-CM

## 2020-04-17 NOTE — Progress Notes (Signed)
   Covid-19 Vaccination Clinic  Name:  Jasmine Hurley    MRN: [id number] DOB: June 22, 1996  04/17/2020  [name] was observed post Covid-19 immunization for 15 minutes without incident. She was provided with Vaccine Information Sheet and instruction to access the V-Safe system.   [name] was instructed to call 911 with any severe reactions post vaccine: Marland Kitchen Difficulty breathing  . Swelling of face and throat  . A fast heartbeat  . A bad rash all over body  . Dizziness and weakness   Immunizations Administered    Name Date Dose VIS Date Route   Pfizer COVID-19 Vaccine 04/17/2020  9:18 AM 0.3 mL 08/29/2018 Intramuscular   Manufacturer: [employer], [employer]   Lot: [id number]   NDC: [address]-4    2

## 2020-04-21 ENCOUNTER — Telehealth: Payer: Self-pay | Admitting: Emergency Medicine

## 2020-04-21 ENCOUNTER — Other Ambulatory Visit: Payer: Self-pay

## 2020-04-21 ENCOUNTER — Encounter: Payer: Self-pay | Admitting: Family Medicine

## 2020-04-21 ENCOUNTER — Ambulatory Visit: Payer: Managed Care, Other (non HMO) | Admitting: Family Medicine

## 2020-04-21 ENCOUNTER — Ambulatory Visit (INDEPENDENT_AMBULATORY_CARE_PROVIDER_SITE_OTHER): Payer: Managed Care, Other (non HMO) | Admitting: Family Medicine

## 2020-04-21 VITALS — BP 141/92 | HR 84 | Temp 98.5°F | Ht 65.0 in | Wt 189.0 lb

## 2020-04-21 DIAGNOSIS — Z23 Encounter for immunization: Secondary | ICD-10-CM

## 2020-04-21 DIAGNOSIS — Z3009 Encounter for other general counseling and advice on contraception: Secondary | ICD-10-CM

## 2020-04-21 DIAGNOSIS — B9689 Other specified bacterial agents as the cause of diseases classified elsewhere: Secondary | ICD-10-CM | POA: Diagnosis not present

## 2020-04-21 DIAGNOSIS — N898 Other specified noninflammatory disorders of vagina: Secondary | ICD-10-CM | POA: Diagnosis not present

## 2020-04-21 DIAGNOSIS — Z3042 Encounter for surveillance of injectable contraceptive: Secondary | ICD-10-CM

## 2020-04-21 DIAGNOSIS — N760 Acute vaginitis: Secondary | ICD-10-CM

## 2020-04-21 LAB — URINALYSIS, DIPSTICK ONLY
Bilirubin, UA: NEGATIVE
Glucose, UA: NEGATIVE
Leukocytes,UA: NEGATIVE
Nitrite, UA: NEGATIVE
RBC, UA: NEGATIVE
Specific Gravity, UA: 1.025 (ref 1.005–1.030)
Urobilinogen, Ur: 0.2 mg/dL (ref 0.2–1.0)
pH, UA: 7 (ref 5.0–7.5)

## 2020-04-21 LAB — POCT WET + KOH PREP
Trich by wet prep: ABSENT
Yeast by KOH: ABSENT
Yeast by wet prep: ABSENT

## 2020-04-21 LAB — POCT URINE PREGNANCY: Preg Test, Ur: NEGATIVE

## 2020-04-21 MED ORDER — MEDROXYPROGESTERONE ACETATE 150 MG/ML IM SUSP
150.0000 mg | INTRAMUSCULAR | Status: AC
  Administered 2020-04-23: 150 mg via INTRAMUSCULAR

## 2020-04-21 MED ORDER — METRONIDAZOLE 500 MG PO TABS: 500.0000 mg | ORAL_TABLET | Freq: Two times a day (BID) | ORAL | 0 refills | Status: DC

## 2020-04-21 MED ORDER — MEDROXYPROGESTERONE ACETATE 150 MG/ML IM SUSP: 150.0000 mg | Freq: Once | INTRAMUSCULAR | 0 refills | Status: DC

## 2020-04-21 NOTE — Telephone Encounter (Signed)
Patient returned called and was informed that she did not receive her Medroxyprogesterone Depo today in office. She can return to the office to receive this injection. Patient stated she can not come in the office today or tomorrow but will try to return in the next few days. She was informed she did not need an appt just let us know she is here for her Depo-Provera only. Patient also want her flu shot to show up on her after visit summary which I explain that this is on her clinical summary/after visit summary.

## 2020-04-21 NOTE — Patient Instructions (Addendum)
Previous Visits for vaginal discharge: 01/10/20, 09/14/19, 04/2019, 02/2019, 06/2018, 04/2018, 03/2018, 08/2017, 03/2017 Treated for BV multiple times  Upcoming OB visit 05/01/2020  Will follow up with Results   Vaginitis  Vaginitis is irritation and swelling (inflammation) of the vagina. It happens when normal bacteria and yeast in the vagina grow too much. There are many types of this condition. Treatment will depend on the type you have. Follow these instructions at home: Lifestyle  Keep your vagina area clean and dry. ? Avoid using soap. ? Rinse the area with water.  Do not do the following until your doctor says it is okay: ? Wash and clean out the vagina (douche). ? Use tampons. ? Have sex.  Wipe from front to back after going to the bathroom.  Let air reach your vagina. ? Wear cotton underwear. ? Do not wear:  Underwear while you sleep.  Tight pants.  Thong underwear.  Underwear or nylons without a cotton panel. ? Take off any wet clothing, such as bathing suits, as soon as possible.  Use gentle, non-scented products. Do not use things that can irritate the vagina, such as fabric softeners. Avoid the following products if they are scented: ? Feminine sprays. ? Detergents. ? Tampons. ? Feminine hygiene products. ? Soaps or bubble baths.  Practice safe sex and use condoms. General instructions  Take over-the-counter and prescription medicines only as told by your doctor.  If you were prescribed an antibiotic medicine, take or use it as told by your doctor. Do not stop taking or using the antibiotic even if you start to feel better.  Keep all follow-up visits as told by your doctor. This is important. Contact a doctor if:  You have pain in your belly.  You have a fever.  Your symptoms last for more than 2-3 days. Get help right away if:  You have a fever and your symptoms get worse all of a sudden. Summary  Vaginitis is irritation and swelling of the  vagina. It can happen when the normal bacteria and yeast in the vagina grow too much. There are many types.  Treatment will depend on the type you have.  Do not douche, use tampons , or have sex until your health care provider approves. When you can return to sex, practice safe sex and use condoms. This information is not intended to replace advice given to you by your health care provider. Make sure you discuss any questions you have with your health care provider. Document Revised: 06/03/2017 Document Reviewed: 07/13/2016 Elsevier Patient Education  [employer].  If you have lab work done today you will be contacted with your lab results within the next 2 weeks.  If you have not heard from [place] then please contact us. The fastest way to get your results is to register for My Chart.   IF you received an x-ray today, you will receive an invoice from [facility]. Please contact [facility] at [phone] with questions or concerns regarding your invoice.   IF you received labwork today, you will receive an invoice from [facility]. Please contact [facility] at [phone] with questions or concerns regarding your invoice.   Our billing staff will not be able to assist you with questions regarding bills from these companies.  You will be contacted with the lab results as soon as they are available. The fastest way to get your results is to activate your My Chart account. Instructions are located on the last page of this paperwork.  If you have not heard from [place] regarding the results in 2 weeks, please contact this office.

## 2020-04-21 NOTE — Telephone Encounter (Signed)
Left a msg on patient machine. When patient return call please have patient transferred to [facility]

## 2020-04-21 NOTE — Progress Notes (Addendum)
10/18/20219:29 AM  Jasmine Hurley [birth year], 24 y.o., female [id number]  Chief Complaint  Patient presents with  . Vaginal Discharge    x 2 weeks, green, cottage cheese like, itching     HPI:   Patient is a 24 y.o. female with no past medical history significant who presents today for vaginal discharge.  Recently treated for BV in July Current symptoms of green vaginal discharge for past 2 weeks: Denies odor, This happens frequently and when discharge increases as does her symptoms of pelvic pain. Current symptoms are improving. She hasn't tried anything to improve her symptoms. On Depo Shots, but past her 3 months: No period since lapse  She has been treated frequently for vaginal discharge: 01/10/20, 09/14/19, 04/2019, 02/2019, 06/2018, 04/2018, 03/2018, 08/2017, 03/2017 Treated for BV multiple times with flagyl  Seeing Gyn on 05/01/2020  Getting Depo Injection Toay    Depression screen [facility] 2/9 04/21/2020 01/10/2020 09/14/2019  Decreased Interest 0 0 0  Down, Depressed, Hopeless 0 0 0  PHQ - 2 Score 0 0 0    Fall Risk  04/21/2020 01/10/2020 09/14/2019 06/21/2019 05/07/2019  Falls in the past year? 0 0 0 0 0  Number falls in past yr: 0 0 0 0 0  Injury with Fall? 0 0 0 0 0  Follow up Falls evaluation completed Falls evaluation completed Falls evaluation completed - Falls evaluation completed     No Known Allergies  Prior to Admission medications   Medication Sig Start Date End Date Taking? Authorizing Provider  metroNIDAZOLE (FLAGYL) 500 MG tablet Take 1 tablet (500 mg total) by mouth 2 (two) times daily with a meal. DO NOT CONSUME ALCOHOL WHILE TAKING THIS MEDICATION. 01/10/20   Peyton Najjar, MD    No past medical history on file.  No past surgical history on file.  Social History   Tobacco Use  . Smoking status: Never Smoker  . Smokeless tobacco: Never Used  Substance Use Topics  . Alcohol use: Yes    Family History  Problem Relation Age of Onset  .  Hypertension Mother   . Hypertension Father     Review of Systems  Constitutional: Negative for chills and fever.  Respiratory: Negative for cough and shortness of breath.   Cardiovascular: Negative for chest pain.  Gastrointestinal: Positive for abdominal pain. Negative for constipation, diarrhea, nausea and vomiting.  Genitourinary: Negative for dysuria, flank pain, frequency, hematuria and urgency.  Skin: Positive for itching. Negative for rash.  Neurological: Negative for headaches.     OBJECTIVE:  Today's Vitals   04/21/20 0816  BP: (!) 141/92  Pulse: 84  Temp: 98.5 F (36.9 C)  SpO2: 98%  Weight: 189 lb (85.7 kg)  Height: 5\' 5"  (1.651 m)   Body mass index is 31.45 kg/m.   Physical Exam Constitutional:      General: She is not in acute distress.    Appearance: Normal appearance. She is not ill-appearing.  HENT:     Head: Normocephalic.  Cardiovascular:     Rate and Rhythm: Normal rate and regular rhythm.     Pulses: Normal pulses.     Heart sounds: Normal heart sounds. No murmur heard.  No friction rub. No gallop.   Pulmonary:     Effort: Pulmonary effort is normal. No respiratory distress.     Breath sounds: Normal breath sounds. No stridor. No wheezing, rhonchi or rales.  Abdominal:     General: Bowel sounds are normal. There is no distension.  Palpations: Abdomen is soft.     Tenderness: There is no abdominal tenderness.  Skin:    General: Skin is warm and dry.  Neurological:     Mental Status: She is alert and oriented to person, place, and time.  Psychiatric:        Mood and Affect: Mood normal.   No pelvic exam at this time due to upcoming GYN appointment  Results for orders placed or performed in visit on 04/21/20 (from the past 24 hour(s))  POCT Wet + KOH Prep     Status: Abnormal   Collection Time: 04/21/20  8:45 AM  Result Value Ref Range   Yeast by KOH Absent Absent   Yeast by wet prep Absent Absent   WBC by wet prep Few Few   Clue  Cells Wet Prep HPF POC Moderate (A) None   Trich by wet prep Absent Absent   Bacteria Wet Prep HPF POC Many (A) Few   Epithelial Cells By [employer] ([facility]) Few None, Few, Too numerous to count   RBC,UR,HPF,POC None None RBC/hpf  POCT urine pregnancy     Status: None   Collection Time: 04/21/20  9:00 AM  Result Value Ref Range   Preg Test, Ur Negative Negative   No results found.  ASSESSMENT and PLAN  Problem List Items Addressed This Visit      Other   Vaginal discharge - Primary   Relevant Medications   metroNIDAZOLE (FLAGYL) 500 MG tablet   Other Relevant Orders   Urinalysis, dipstick only   POCT Wet + KOH Prep (Completed)   POCT urine pregnancy (Completed)    Other Visit Diagnoses    Encounter for immunization       Relevant Orders   Flu Vaccine QUAD 6+ mos PF IM (Fluarix Quad PF) (Completed)   Encounter for other general counseling or advice on contraception       Encounter for management and injection of depo-Provera       BV (bacterial vaginosis)       Relevant Medications   metroNIDAZOLE (FLAGYL) 500 MG tablet     Discussed importance of GYN to discuss recurrent BV despite treatment and the continued issues of vaginal discharge and pelvic pain.  Prev appointments at this clinic for vaginal discharge and pain: 01/10/20, 09/14/19, 04/2019, 02/2019, 06/2018, 04/2018, 03/2018, 08/2017, 03/2017 Treated for BV multiple times with flagyl  Positive for clue cells at this appointment, will treat for BV  Depo Injection given at this visit post negative pregnancy test  Seeing Gyn on 05/01/2020  Return for At annual physical. I have reviewed and agree with above documentation. Edwina Barth, MD   Macario Carls Teliyah Royal, FNP-BC Primary Care at [facility] [address] [place], [place] [address] Ph.  [phone] Fax [phone]

## 2020-04-21 NOTE — Telephone Encounter (Signed)
Left another msg for patient to return my call. 

## 2020-04-21 NOTE — Telephone Encounter (Signed)
I attempted to reach out to the patient once again. No answer. I did not leave at [facility] this time. I will try again tomorrow.

## 2020-04-23 ENCOUNTER — Other Ambulatory Visit: Payer: Self-pay

## 2020-04-23 ENCOUNTER — Ambulatory Visit (INDEPENDENT_AMBULATORY_CARE_PROVIDER_SITE_OTHER): Payer: Managed Care, Other (non HMO) | Admitting: Family Medicine

## 2020-04-23 DIAGNOSIS — Z3042 Encounter for surveillance of injectable contraceptive: Secondary | ICD-10-CM | POA: Diagnosis not present

## 2020-04-23 MED ORDER — MEDROXYPROGESTERONE ACETATE 150 MG/ML IM SUSP: 150.0000 mg | INTRAMUSCULAR | Status: DC

## 2020-04-23 NOTE — Patient Instructions (Signed)
Shot given in RUOQ and tolerated well  Next Depo shot Due Jan 5 - Jan19    Pt aware.

## 2020-06-12 ENCOUNTER — Telehealth (INDEPENDENT_AMBULATORY_CARE_PROVIDER_SITE_OTHER): Payer: Managed Care, Other (non HMO) | Admitting: Family Medicine

## 2020-06-12 ENCOUNTER — Encounter: Payer: Self-pay | Admitting: Family Medicine

## 2020-06-12 ENCOUNTER — Other Ambulatory Visit: Payer: Self-pay

## 2020-06-12 VITALS — Ht 66.0 in | Wt 188.0 lb

## 2020-06-12 DIAGNOSIS — R059 Cough, unspecified: Secondary | ICD-10-CM

## 2020-06-12 DIAGNOSIS — R52 Pain, unspecified: Secondary | ICD-10-CM

## 2020-06-12 DIAGNOSIS — J029 Acute pharyngitis, unspecified: Secondary | ICD-10-CM | POA: Diagnosis not present

## 2020-06-12 DIAGNOSIS — R5383 Other fatigue: Secondary | ICD-10-CM

## 2020-06-12 DIAGNOSIS — R197 Diarrhea, unspecified: Secondary | ICD-10-CM

## 2020-06-12 MED ORDER — AZITHROMYCIN 250 MG PO TABS: ORAL_TABLET | ORAL | 0 refills | Status: DC

## 2020-06-12 NOTE — Progress Notes (Signed)
Virtual Visit via Telephone Note  I connected with Jasmine Hurley on 06/12/20 at 5:46 PM by telephone and verified that I am speaking with the correct person using two identifiers. Patient location:home.  My location: office.    I discussed the limitations, risks, security and privacy concerns of performing an evaluation and management service by telephone and the availability of in person appointments. I also discussed with the patient that there may be a patient responsible charge related to this service. The patient expressed understanding and agreed to proceed, consent obtained  Chief complaint: Chief Complaint  Patient presents with  . URI    Pt reports starting on Tuesday she has had headaches, sore throat, and noticed blood in mucus. Pt hasn't had a COVID-19 test. Pt had a cough last month and it has come and gone. Pt reports on Tuesday she has some light headedness and some dizziness.      History of Present Illness:   Jasmine Hurley is a 24 y.o. female   Cough: Cold symptoms beginning of November. Only had symptoms 4 days at that time. Stayed home and felt better with otc meds.  Started back with cough about 05/19/20 -  Green mucus with cough, cough worsened about 05/26/20. Coughing fits, no fever, no dyspnea. No loss of taste or smell. No treatments - just drank fluids, and coffee. Cough resolved over [place] vacation at the beach. Returned 11/29 - cough returned mild cough here and there - no cough spells, just dry cough.   12/7 started with sore throat, fatigue, headache, bodyaches and loss of appetite 2 days ago, some dots of blood in mucus at times, but not much cough. Persistent green mucus. No nasal d/c.  No dyspnea.  Able to drink fluids and normal urination.  Has had some diarrhea past day. No vomiting.About 4-5 episodes. Non bloody.  No known sick contacts.  Has not had covid testing.  Dizziness initially on 12/7, not today. Feeling a little better today  less sore throat. Still a little tired.  Tx: nyquil  COVID-19 vaccination August 25th, October 14th, 2021.  Pfizer.  Patient Active Problem List   Diagnosis Date Noted  . Adjustment insomnia 10/30/2019  . Bacterial vaginosis 10/30/2019  . 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 Vaginal discharge 09/14/2019  . Encounter for counseling regarding initiation of other contraceptive measure 09/14/2019  . Abnormal urinalysis 09/14/2019  . Annual physical exam 06/21/2019  . Thyromegaly 01/15/2016   No past medical history on file. No past surgical history on file. No Known Allergies Prior to Admission medications   Medication Sig Start Date End Date Taking? Authorizing Provider  medroxyPROGESTERone (DEPO-PROVERA) 150 MG/ML injection Inject 150 mg into the muscle every 3 (three) months.   Yes [provider]  metroNIDAZOLE (FLAGYL) 500 MG tablet Take 1 tablet (500 mg total) by mouth 2 (two) times daily with a meal. DO NOT CONSUME ALCOHOL WHILE TAKING THIS MEDICATION. Patient not taking: Reported on 06/12/2020 04/21/20   Just, Azalee Course, FNP   Social History   Socioeconomic History  . Marital status: Single    Spouse name: Not on file  . Number of children: Not on file  . Years of education: Not on file  . Highest education level: Not on file  Occupational History  . Not on file  Tobacco Use  . Smoking status: Never Smoker  . Smokeless tobacco: Never Used  Substance and Sexual Activity  . Alcohol use: Yes  . Drug use: No  .  Sexual activity: Never    Comment: Depo shot  Other Topics Concern  . Not on file  Social History Narrative  . Not on file   Social Determinants of Health   Financial Resource Strain: Not on file  Food Insecurity: Not on file  Transportation Needs: Not on file  Physical Activity: Not on file  Stress: Not on file  Social Connections: Not on file  Intimate Partner Violence: Not on file     Observations/Objective: Vitals:   06/12/20 1250  Weight:  188 lb (85.3 kg)  Height: 5\' 6"  (1.676 m)   Speaking in complete sentences, no apparent respiratory distress on phone, appropriate responses, all questions were answered with understanding of plan expressed as well as ER precautions.  Assessment and Plan: Cough  -Relapsing remitting over the past 1 month with persistent discolored phlegm.  Cough at this time, possible postnasal drip but denies sinus symptoms.  Few specks of blood without frank hemoptysis.  Lower respiratory infection/atypical infection possible, will start azithromycin but potential side effects discussed including worsening diarrhea.  Understanding expressed.  ER/urgent care precautions.  Fatigue, unspecified type Sore throat Diarrhea, unspecified type Body aches  -New symptoms past 2 days.  Has received Covid vaccine, differential includes breakthrough infection.  Some symptom improvement past few days but diarrhea today.  Staying hydrated, able to tolerate fluids.  No red flags on history at this time.  Symptomatic care discussed with Mucinex or NyQuil, fluids, saline nasal spray if needed for congestion.  Drive up testing for test tomorrow.  ER precautions, isolation for now.  Follow Up Instructions: Drive up test tomorrow.    I discussed the assessment and treatment plan with the patient. The patient was provided an opportunity to ask questions and all were answered. The patient agreed with the plan and demonstrated an understanding of the instructions.   The patient was advised to call back or seek an in-person evaluation if the symptoms worsen or if the condition fails to improve as anticipated.  I provided 24 minutes of non-face-to-face time during this encounter.  Signed,   [id number], MD Primary Care at [facility].  06/12/20

## 2020-08-05 ENCOUNTER — Ambulatory Visit: Payer: Self-pay

## 2020-08-29 IMAGING — US US ART/VEN ABD/PELV/SCROTUM DOPPLER LTD
1 series · 14 of 25 positions shown · non-contrast
Comparison: 01/29/2016.

CLINICAL DATA: Evaluate for torsion.

EXAM:
DOPPLER ULTRASOUND OF OVARIES
TECHNIQUE: Color and duplex Doppler ultrasound was utilized to evaluate blood
flow to the ovaries.

[Series 1: us art/ven abd/pelv/scrotum doppler ltd · 14 of 98 slices shown]
[im 1/98]
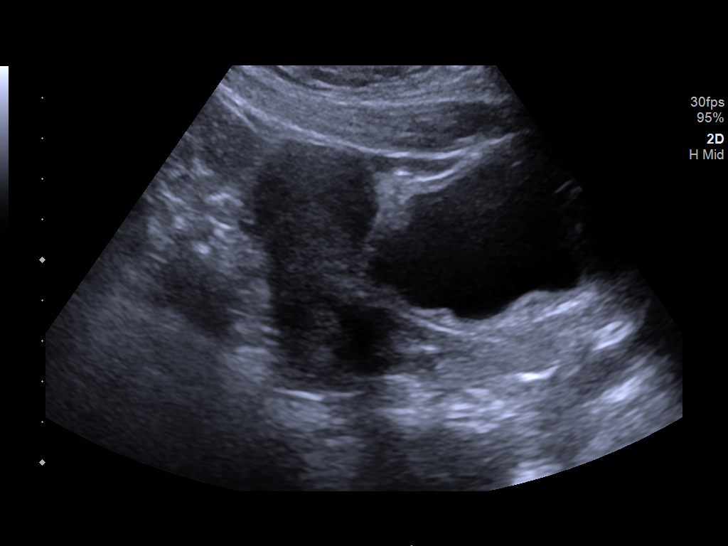
[im 9/98]
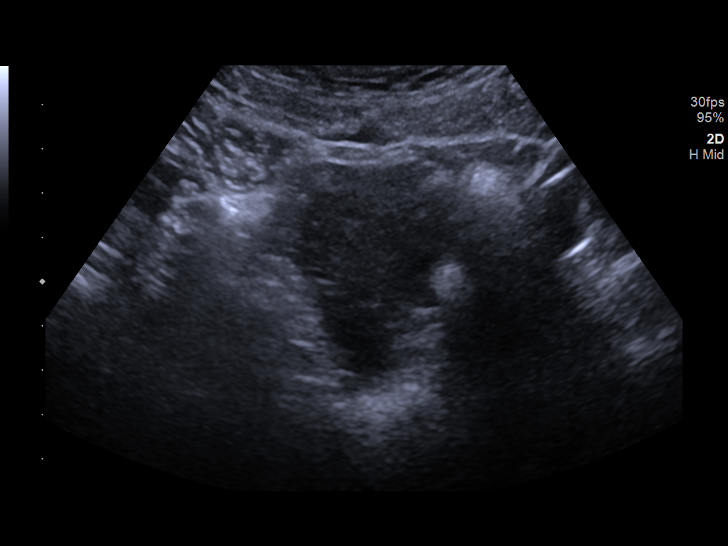
[im 17/98]
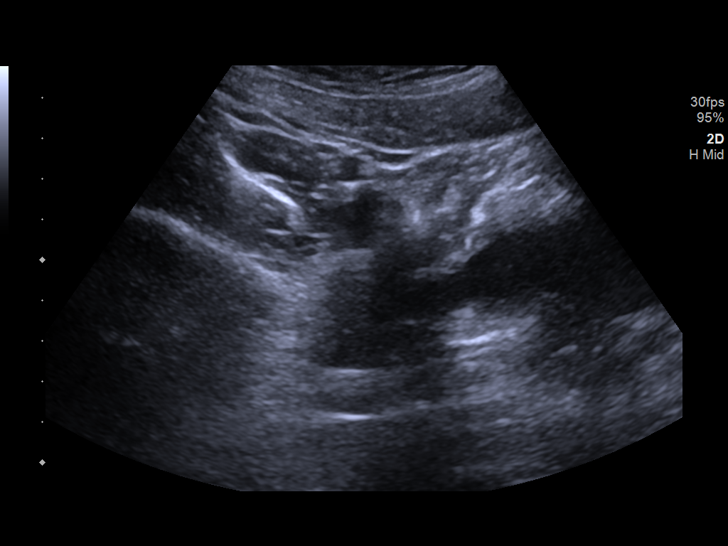
[im 25/98]
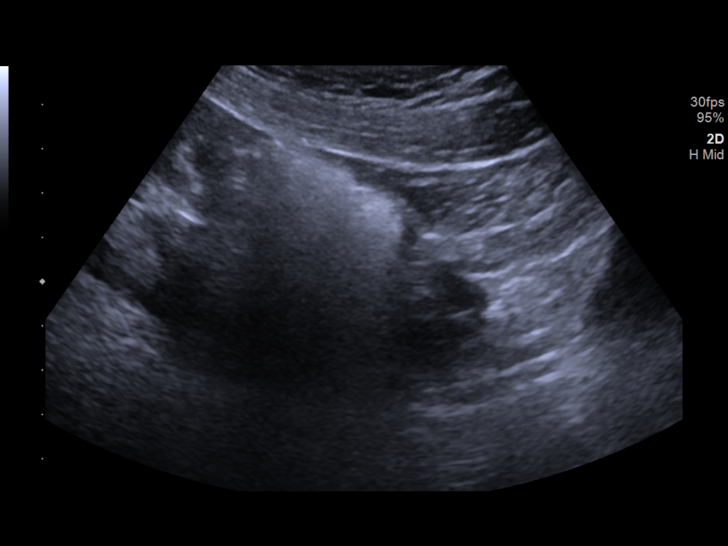
[im 33/98]
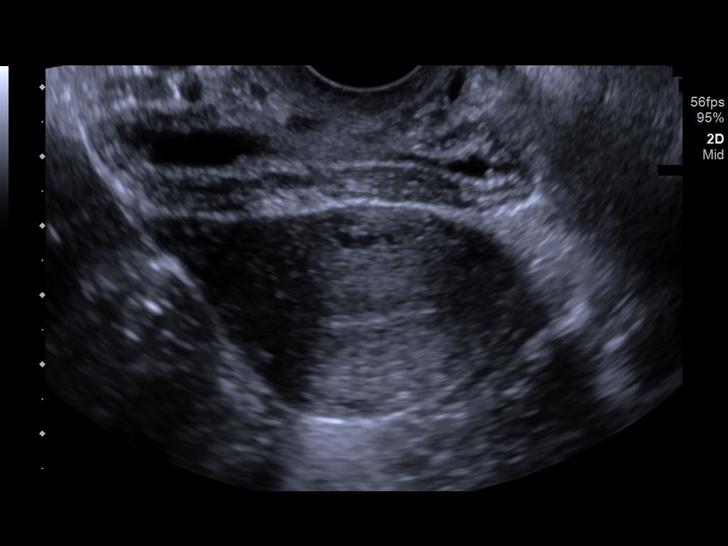
[im 37/98]
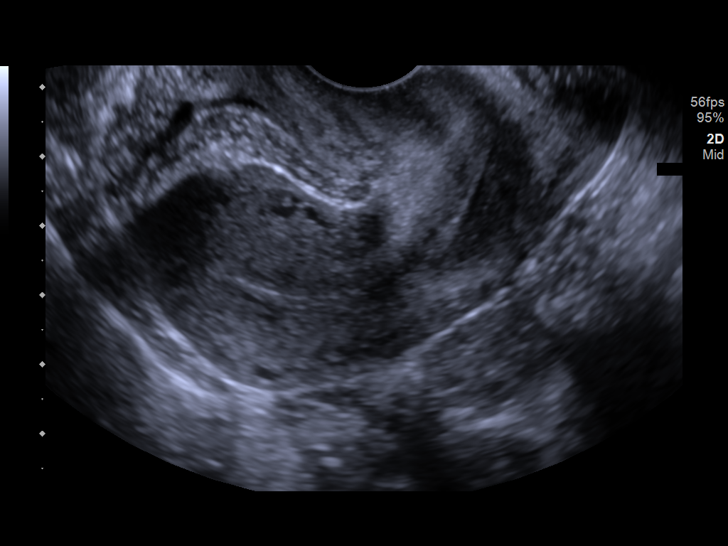
[im 45/98]
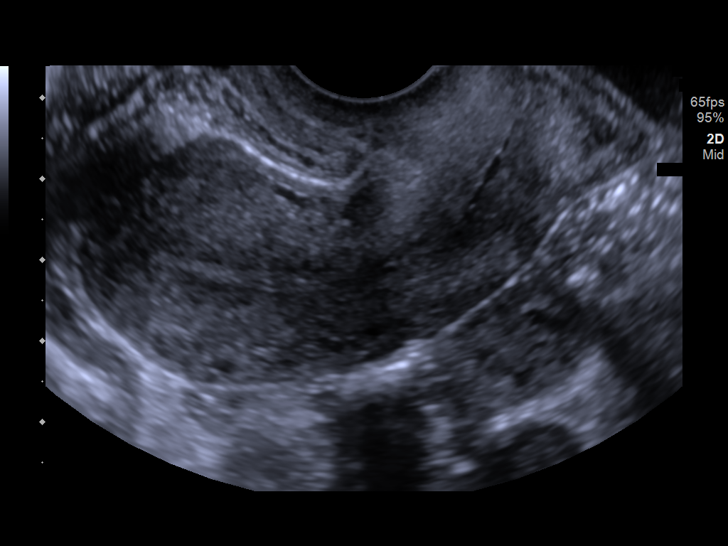
[im 53/98]
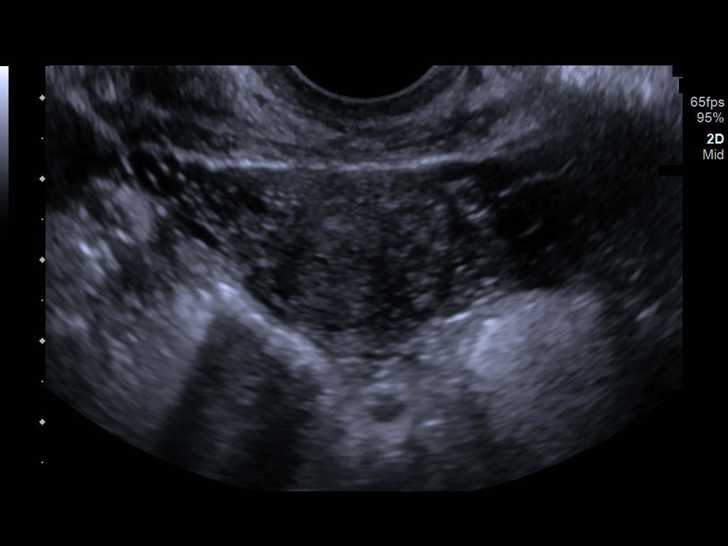
[im 61/98]
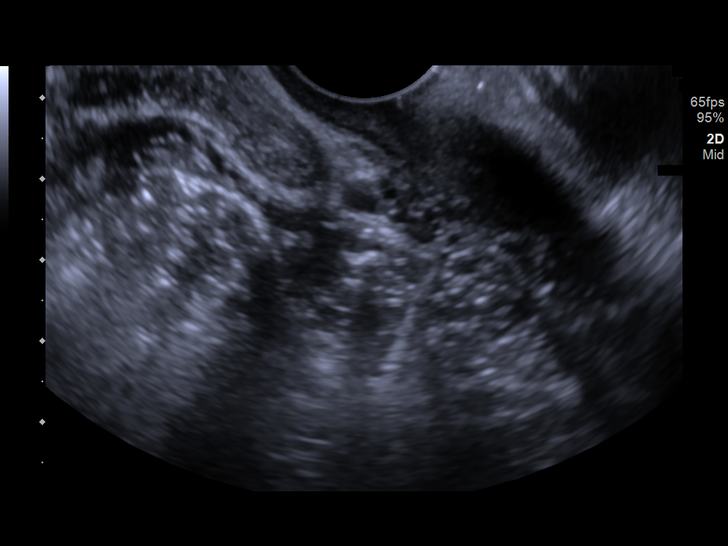
[im 65/98]
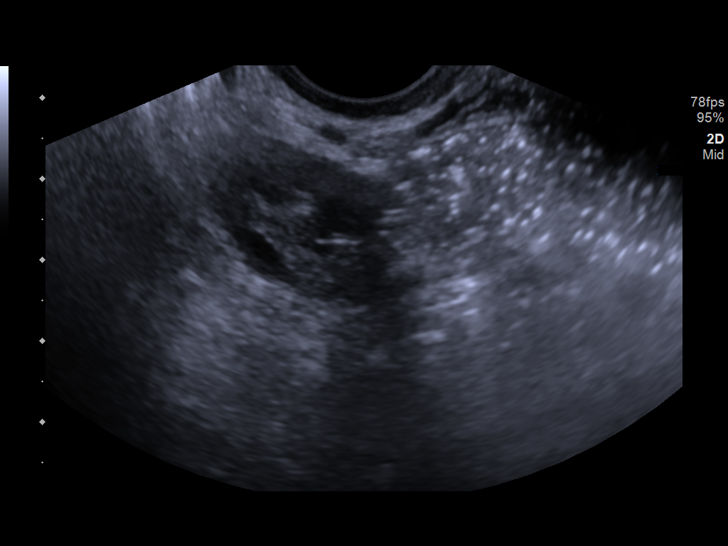
[im 73/98]
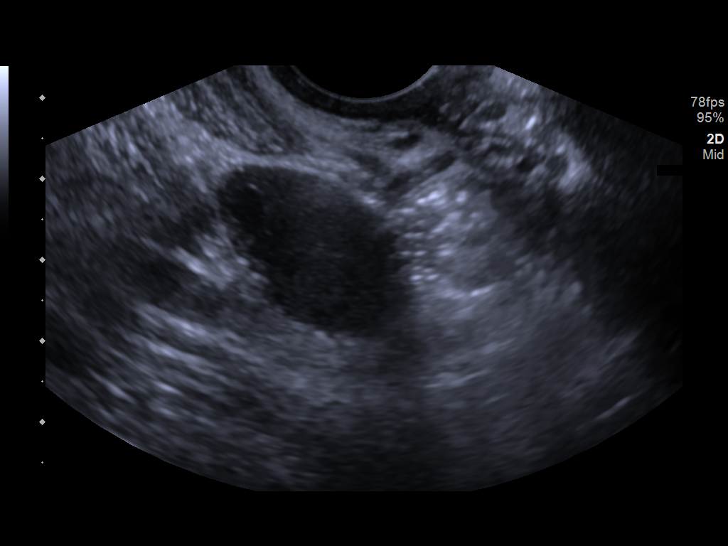
[im 81/98]
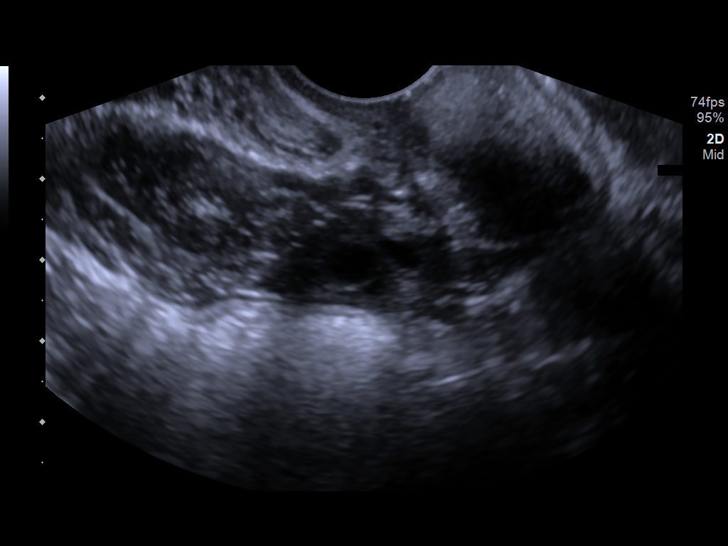
[im 89/98]
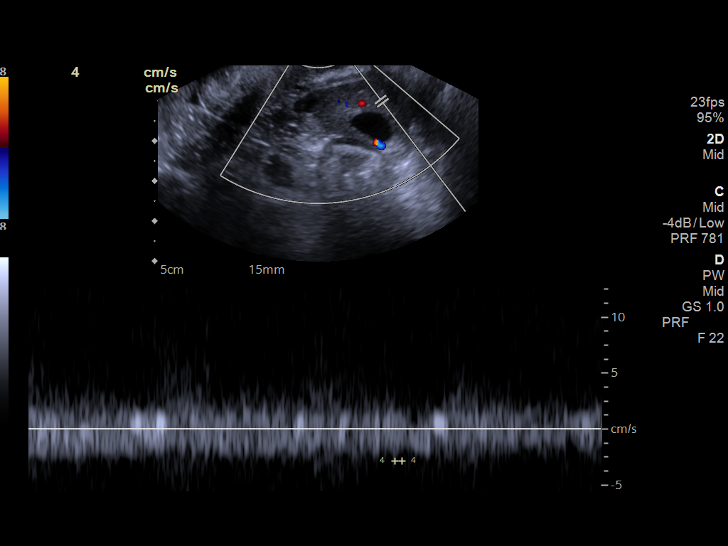
[im 98/98]
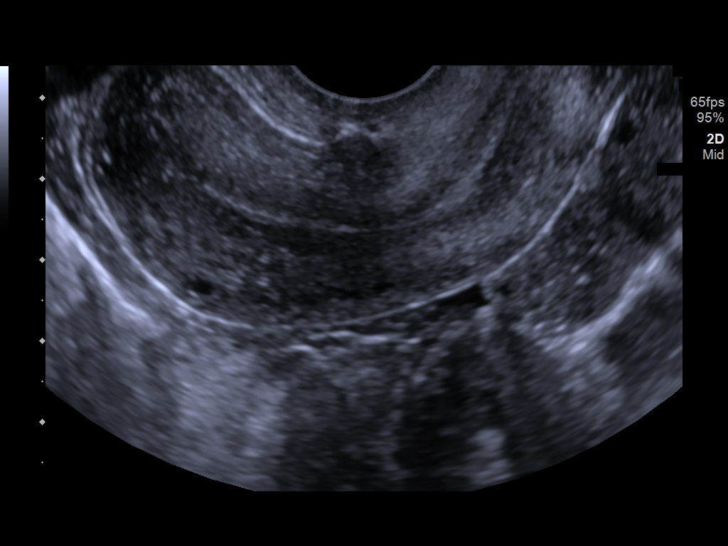

[14 of 25 positions shown; findings below may reference images not displayed]

FINDINGS: Pulsed Doppler evaluation of both ovaries demonstrates normal
low-resistance arterial and venous waveforms.
IMPRESSION: No evidence of torsion.

## 2020-08-29 IMAGING — US US PELVIS COMPLETE WITH TRANSVAGINAL
1 series · 13 of 25 positions shown · non-contrast
Comparison: None.

CLINICAL DATA: Pelvic pain for 6 days

EXAM:
TRANSABDOMINAL AND TRANSVAGINAL ULTRASOUND OF PELVIS
DOPPLER ULTRASOUND OF OVARIES
TECHNIQUE: Both transabdominal and transvaginal ultrasound examinations of the
pelvis were performed. Transabdominal technique was performed for
global imaging of the pelvis including uterus, ovaries, adnexal
regions, and pelvic cul-de-sac.
It was necessary to proceed with endovaginal exam following the
transabdominal exam to visualize the endometrium and ovaries. Color
and duplex Doppler ultrasound was utilized to evaluate blood flow to
the ovaries.

[Series 1: us pelvis complete with transvaginal · 13 of 98 slices shown]
[im 1/98]
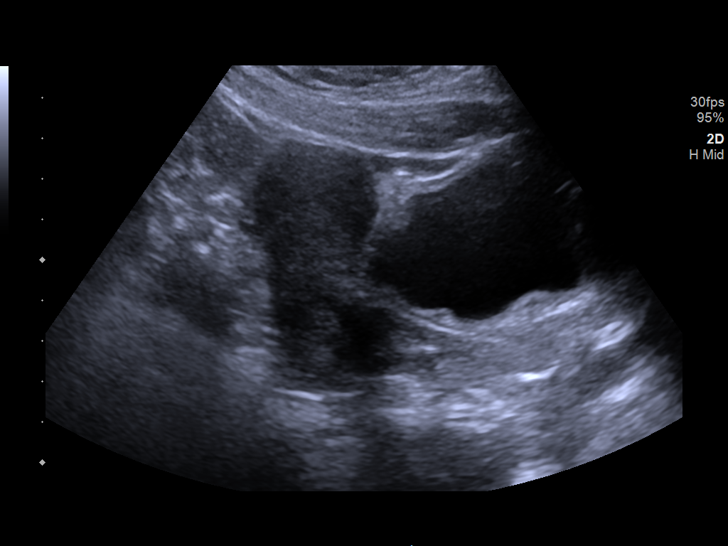
[im 9/98]
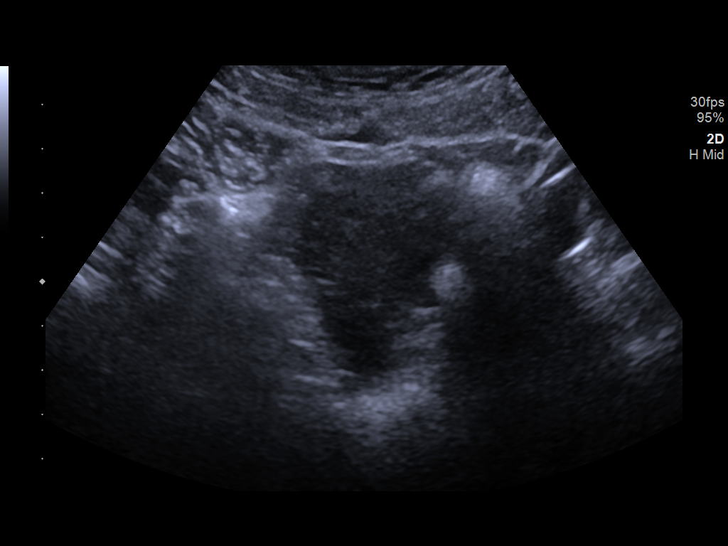
[im 17/98]
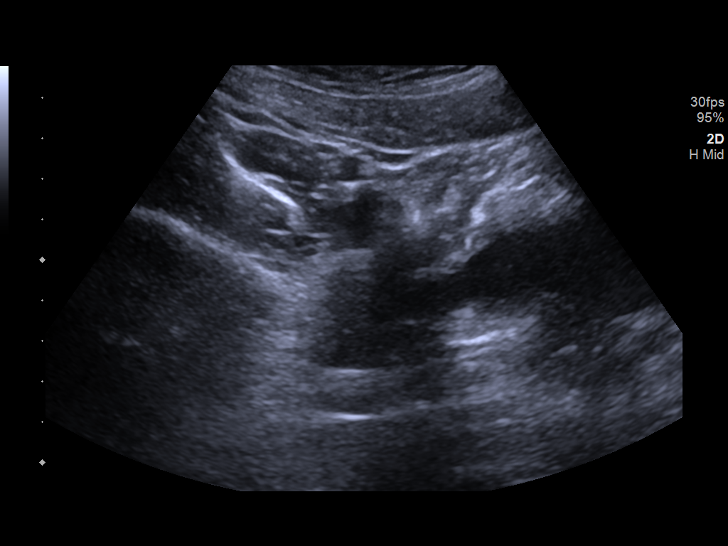
[im 25/98]
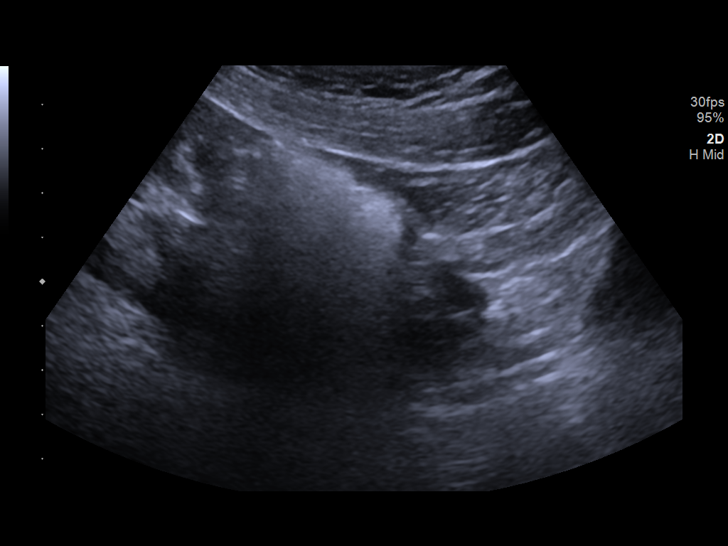
[im 33/98]
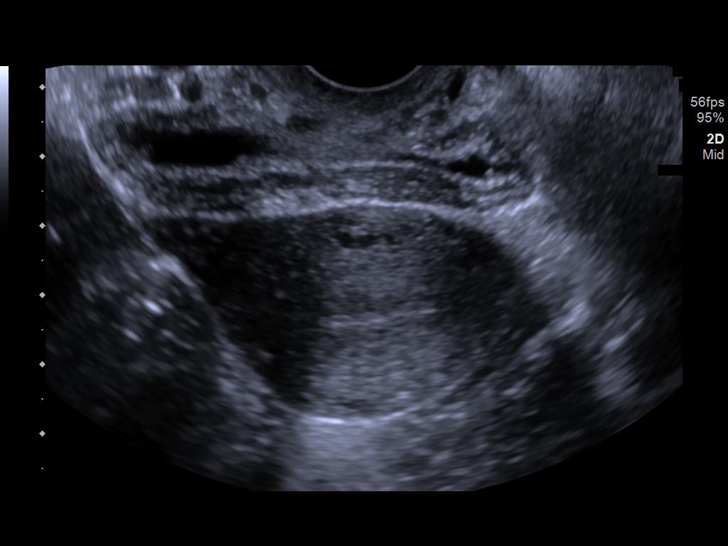
[im 41/98]
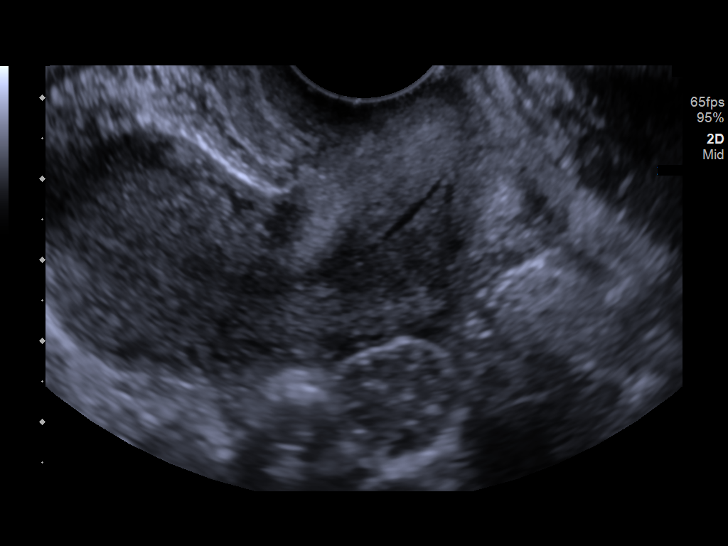
[im 49/98]
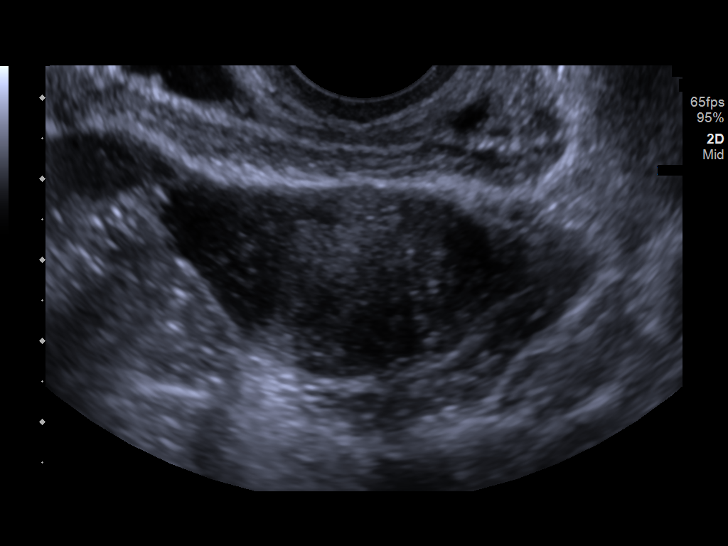
[im 57/98]
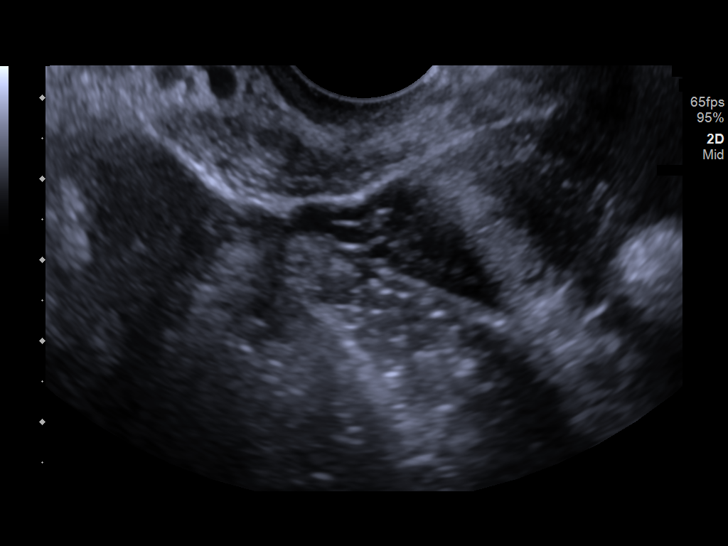
[im 65/98]
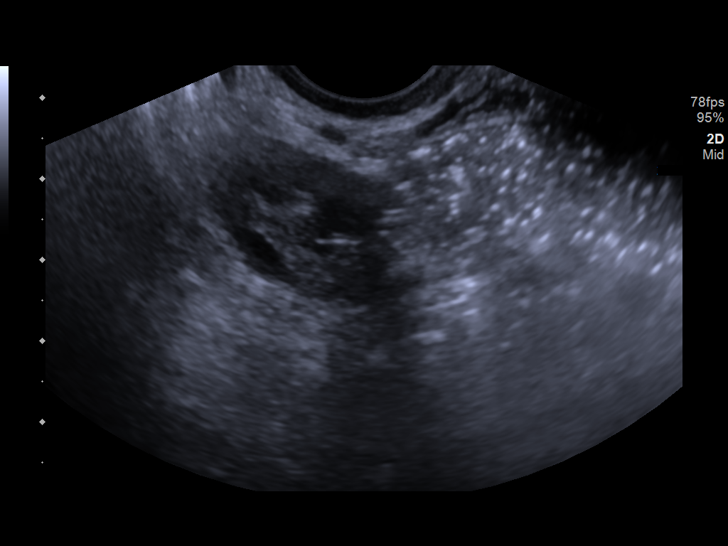
[im 73/98]
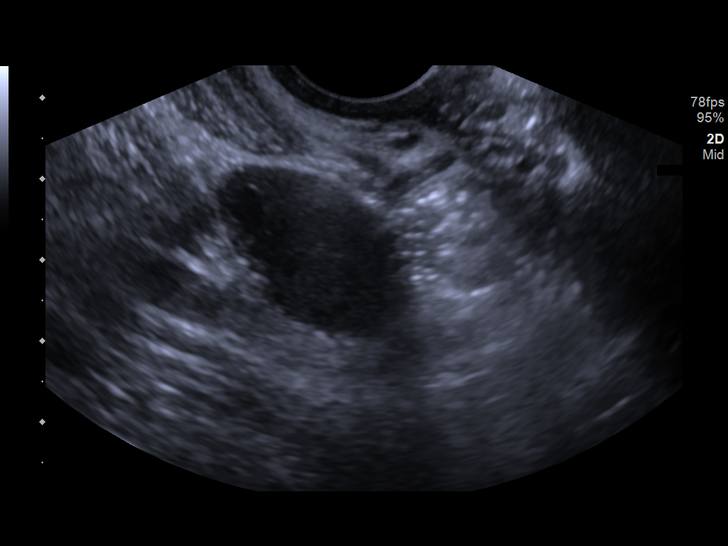
[im 81/98]
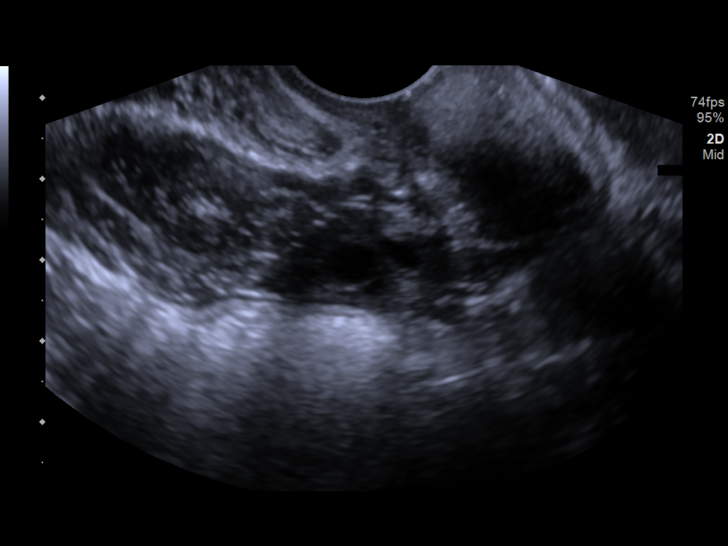
[im 89/98]
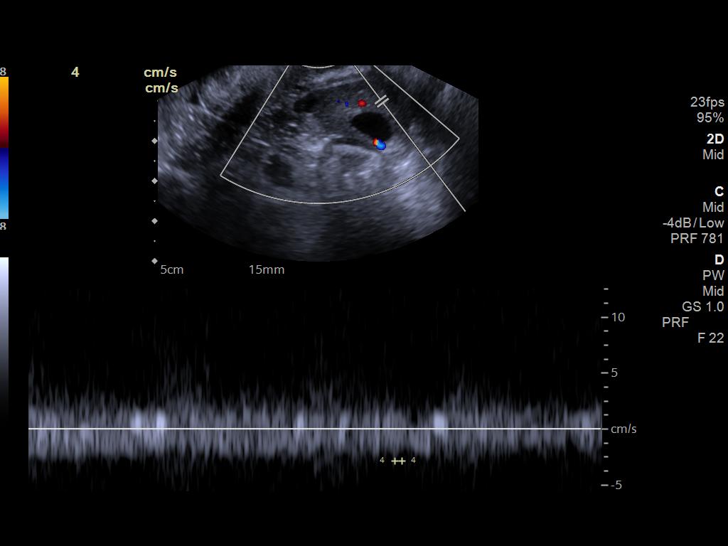
[im 98/98]
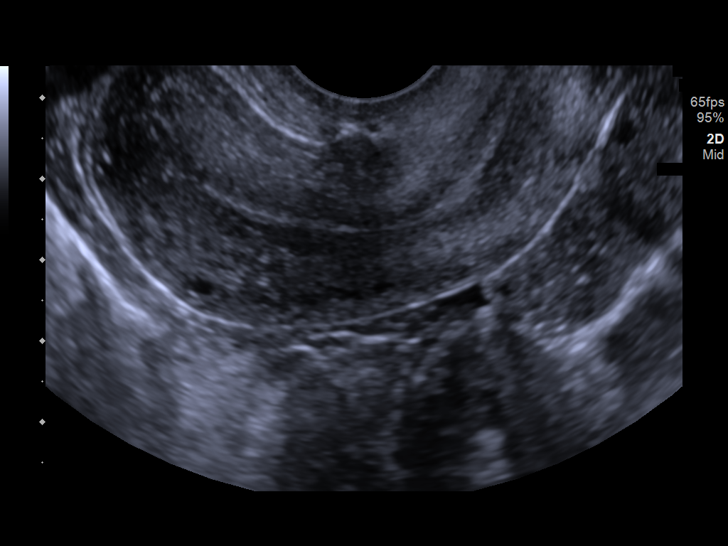

[13 of 25 positions shown; findings below may reference images not displayed]

FINDINGS: Uterus

Measurements: 6.5 x 3.4 x 4.4 cm = volume: 51 mL. No fibroids or
other mass visualized.

Endometrium

Thickness: 2.7 mm.  No focal abnormality visualized.

Right ovary

Measurements: 2.9 x 1.6 x 2.5 cm = volume: 6 mL. Normal
appearance/no adnexal mass.

Left ovary

Measurements: 3.6 x 1.9 x 2.9 cm = volume: 9.9 mL. Normal
appearance/no adnexal mass.

Pulsed Doppler evaluation of both ovaries demonstrates normal
low-resistance arterial and venous waveforms.

Other findings

No abnormal free fluid.
IMPRESSION: Unremarkable ultrasound of the pelvis.

## 2020-11-04 ENCOUNTER — Encounter (HOSPITAL_BASED_OUTPATIENT_CLINIC_OR_DEPARTMENT_OTHER): Payer: Self-pay | Admitting: Emergency Medicine

## 2020-11-04 ENCOUNTER — Emergency Department (HOSPITAL_BASED_OUTPATIENT_CLINIC_OR_DEPARTMENT_OTHER)
Admission: EM | Admit: 2020-11-04 | Discharge: 2020-11-04 | Disposition: A | Discharge status: Home / Self Care | Payer: BC Managed Care – PPO | Attending: Emergency Medicine | Admitting: Emergency Medicine

## 2020-11-04 ENCOUNTER — Other Ambulatory Visit: Payer: Self-pay

## 2020-11-04 DIAGNOSIS — R519 Headache, unspecified: Secondary | ICD-10-CM | POA: Diagnosis present

## 2020-11-04 DIAGNOSIS — Z20822 Contact with and (suspected) exposure to covid-19: Secondary | ICD-10-CM | POA: Insufficient documentation

## 2020-11-04 DIAGNOSIS — J069 Acute upper respiratory infection, unspecified: Secondary | ICD-10-CM | POA: Diagnosis not present

## 2020-11-04 LAB — SARS CORONAVIRUS 2 (TAT 6-24 HRS): SARS Coronavirus 2: NEGATIVE

## 2020-11-04 NOTE — Discharge Instructions (Addendum)
COVID testing done and is pending.  Rest of work-up here today without any concerning findings.  Can take over-the-counter medications for your upper respiratory type symptoms.  If COVID testing is positive you would normally quarantine for 7 days.  But you are kind at that point already.  Return for any worse shortness of breath or any worse symptoms.

## 2020-11-04 NOTE — ED Triage Notes (Signed)
Pt state that she has been experiencing, runny nose, congestion, headache and sob after coughing. Pt has has symptoms over last week.

## 2020-11-04 NOTE — ED Provider Notes (Addendum)
[facility] EMERGENCY DEPT Provider Note   CSN: [id number] Arrival date & time: 11/04/20  1019     History Chief Complaint  Patient presents with  . Headache    Jasmine Hurley is a 25 y.o. female.  Patient with 7-day complaint of runny nose congestion headache some shortness of breath after coughing.  Little bit of productive cough.  No fever.  No body aches no vomiting or diarrhea.  Patient has experienced some slight nausea.  Patient states that symptoms are improving.  Sore throat is much better than it was early on.        History reviewed. No pertinent past medical history.  Patient Active Problem List   Diagnosis Date Noted  . Adjustment insomnia 10/30/2019  . Bacterial vaginosis 10/30/2019  . 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 Vaginal discharge 09/14/2019  . Encounter for counseling regarding initiation of other contraceptive measure 09/14/2019  . Abnormal urinalysis 09/14/2019  . Annual physical exam 06/21/2019  . Thyromegaly 01/15/2016    History reviewed. No pertinent surgical history.   OB History   No obstetric history on file.     Family History  Problem Relation Age of Onset  . Hypertension Mother   . Hypertension Father     Social History   Tobacco Use  . Smoking status: Never Smoker  . Smokeless tobacco: Never Used  Substance Use Topics  . Alcohol use: Yes  . Drug use: No    Home Medications Prior to Admission medications   Medication Sig Start Date End Date Taking? Authorizing Provider  azithromycin (ZITHROMAX) 250 MG tablet Take 2 pills by mouth on day 1, then 1 pill by mouth per day on days 2 through 5. 06/12/20   Shade Flood, MD  medroxyPROGESTERone (DEPO-PROVERA) 150 MG/ML injection Inject 150 mg into the muscle every 3 (three) months.    [provider]  metroNIDAZOLE (FLAGYL) 500 MG tablet Take 1 tablet (500 mg total) by mouth 2 (two) times daily with a meal. DO NOT CONSUME ALCOHOL WHILE TAKING THIS  MEDICATION. Patient not taking: No sig reported 04/21/20   Just, Azalee Course, FNP    Allergies    Patient has no known allergies.  Review of Systems   Review of Systems  Constitutional: Negative for chills and fever.  HENT: Positive for congestion and sore throat. Negative for rhinorrhea.   Eyes: Negative for visual disturbance.  Respiratory: Positive for cough and shortness of breath.   Cardiovascular: Negative for chest pain and leg swelling.  Gastrointestinal: Positive for nausea. Negative for abdominal pain, diarrhea and vomiting.  Genitourinary: Negative for dysuria.  Musculoskeletal: Negative for back pain and neck pain.  Skin: Negative for rash.  Neurological: Negative for dizziness, light-headedness and headaches.  Hematological: Does not bruise/bleed easily.  Psychiatric/Behavioral: Negative for confusion.    Physical Exam Updated Vital Signs BP (!) 143/91 (BP Location: Right Arm)   Pulse 88   Temp 98.5 F (36.9 C) (Oral)   Resp 18   Ht 1.676 m (5\' 6" )   Wt 83.9 kg   LMP 09/06/2019 (Approximate)   SpO2 99%   BMI 29.86 kg/m   Physical Exam Vitals and nursing note reviewed.  Constitutional:      General: She is not in acute distress.    Appearance: Normal appearance. She is well-developed. She is not ill-appearing or toxic-appearing.  HENT:     Head: Normocephalic and atraumatic.     Mouth/Throat:     Mouth: Mucous membranes are moist.  Pharynx: Posterior oropharyngeal erythema present.     Comments: Uvula midline.  Slight erythema to the posterior pharynx.  No exudate. Eyes:     Extraocular Movements: Extraocular movements intact.     Conjunctiva/sclera: Conjunctivae normal.     Pupils: Pupils are equal, round, and reactive to light.  Cardiovascular:     Rate and Rhythm: Normal rate and regular rhythm.     Heart sounds: No murmur heard.   Pulmonary:     Effort: Pulmonary effort is normal. No respiratory distress.     Breath sounds: Normal breath  sounds. No wheezing, rhonchi or rales.  Abdominal:     Palpations: Abdomen is soft.     Tenderness: There is no abdominal tenderness.  Musculoskeletal:        General: No swelling. Normal range of motion.     Cervical back: Normal range of motion and neck supple.  Skin:    General: Skin is warm and dry.     Capillary Refill: Capillary refill takes less than 2 seconds.  Neurological:     General: No focal deficit present.     Mental Status: She is alert and oriented to person, place, and time.     ED Results / Procedures / Treatments   Labs (all labs ordered are listed, but only abnormal results are displayed) Labs Reviewed  SARS CORONAVIRUS 2 (TAT 6-24 HRS)    EKG None  Radiology No results found.  Procedures Procedures   Medications Ordered in ED Medications - No data to display  ED Course  I have reviewed the triage vital signs and the nursing notes.  Pertinent labs & imaging results that were available during my care of the patient were reviewed by me and considered in my medical decision making (see chart for details).    MDM Rules/Calculators/A&P                          Clinically nontoxic no acute distress.  Could be upper respiratory infection.  COVID possibility since started with sore throat overall patient feeling better and 7 days into the illness.  COVID testing ordered and is pending.  Patient be given work note for [employer].  If positive patient no longer with fevers and symptoms are improving.  In theory could return to work.  But not have to self isolate for 7 days because already sort of 7 days into the course of the illness.  Recommending over-the-counter medications as needed.  Returning for any new or worse symptoms.     Final Clinical Impression(s) / ED Diagnoses Final diagnoses:  Upper respiratory tract infection, unspecified type    Rx / DC Orders ED Discharge Orders    None       Vanetta Mulders, MD 11/04/20 1051    Vanetta Mulders, MD 11/04/20 1052

## 2021-03-13 ENCOUNTER — Other Ambulatory Visit: Payer: Self-pay

## 2021-03-13 ENCOUNTER — Emergency Department (HOSPITAL_BASED_OUTPATIENT_CLINIC_OR_DEPARTMENT_OTHER)
Admission: EM | Admit: 2021-03-13 | Discharge: 2021-03-13 | Disposition: A | Discharge status: Home / Self Care | Payer: BC Managed Care – PPO | Attending: Emergency Medicine | Admitting: Emergency Medicine

## 2021-03-13 ENCOUNTER — Encounter (HOSPITAL_BASED_OUTPATIENT_CLINIC_OR_DEPARTMENT_OTHER): Payer: Self-pay | Admitting: Emergency Medicine

## 2021-03-13 DIAGNOSIS — S90562A Insect bite (nonvenomous), left ankle, initial encounter: Secondary | ICD-10-CM | POA: Insufficient documentation

## 2021-03-13 DIAGNOSIS — W57XXXA Bitten or stung by nonvenomous insect and other nonvenomous arthropods, initial encounter: Secondary | ICD-10-CM | POA: Diagnosis not present

## 2021-03-13 DIAGNOSIS — L03116 Cellulitis of left lower limb: Secondary | ICD-10-CM | POA: Insufficient documentation

## 2021-03-13 DIAGNOSIS — Z20822 Contact with and (suspected) exposure to covid-19: Secondary | ICD-10-CM | POA: Insufficient documentation

## 2021-03-13 DIAGNOSIS — Z1152 Encounter for screening for COVID-19: Secondary | ICD-10-CM

## 2021-03-13 LAB — RESP PANEL BY RT-PCR (FLU A&B, COVID) ARPGX2
Influenza A by PCR: NEGATIVE
Influenza B by PCR: NEGATIVE
SARS Coronavirus 2 by RT PCR: NEGATIVE

## 2021-03-13 MED ORDER — BACIT-POLY-NEO HC 1 % EX OINT: TOPICAL_OINTMENT | Freq: Once | CUTANEOUS | Status: DC

## 2021-03-13 MED ORDER — BACITRACIN ZINC 500 UNIT/GM EX OINT: TOPICAL_OINTMENT | Freq: Once | CUTANEOUS | Status: DC

## 2021-03-13 MED ORDER — DOXYCYCLINE HYCLATE 100 MG PO CAPS: 100.0000 mg | ORAL_CAPSULE | Freq: Two times a day (BID) | ORAL | 0 refills | Status: AC

## 2021-03-13 NOTE — ED Triage Notes (Signed)
Pt has an allergy to ant bites. She was bitten on her left foot on Wednesday and had progressively worsened. Pt presents with swollen left foot. 3 small bites with pus draining also noted. 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

## 2021-03-13 NOTE — ED Provider Notes (Signed)
[facility] EMERGENCY DEPT Provider Note   CSN: [id number] Arrival date & time: 03/13/21  1014     History Chief Complaint  Patient presents with   Insect Bite    Jasmine Hurley is a 25 y.o. female.  She was bitten by ants on the inside of her left ankle.  She has had some pain and swelling in this area.  She also reports that she would like to be tested for COVID-19.  She has had intermittent chest pain off and on for about a week.  The history is provided by the patient.  Ankle Pain Location:  Ankle Ankle location:  L ankle (bitten on the inside of the left ankle by ants) Pain details:    Quality:  Throbbing   Radiates to:  Does not radiate   Severity:  Mild   Onset quality:  Gradual   Duration:  3 days   Timing:  Constant   Progression:  Unchanged Chronicity:  New Dislocation: no   Prior injury to area:  No Relieved by:  Nothing Worsened by:  Bearing weight Ineffective treatments:  None tried Associated symptoms: swelling   Associated symptoms: no back pain, no decreased ROM, no fever, no numbness and no tingling       History reviewed. No pertinent past medical history.  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 No pertinent surgical history.   OB History   No obstetric history on file.     Family History  Problem Relation Age of Onset   Hypertension Mother    Hypertension Father     Social History   Tobacco Use   Smoking status: Never   Smokeless tobacco: Never  Substance Use Topics   Alcohol use: Yes   Drug use: No    Home Medications Prior to Admission medications   Medication  Sig Start Date End Date Taking? Authorizing Provider  doxycycline (VIBRAMYCIN) 100 MG capsule Take 1 capsule (100 mg total) by mouth 2 (two) times daily for 7 days. 03/13/21 03/20/21 Yes Koleen Distance, MD    Allergies    Patient has no known allergies.  Review of Systems   Review of Systems  Constitutional:  Negative for chills and fever.  HENT:  Negative for ear pain and sore throat.   Eyes:  Negative for pain and visual disturbance.  Respiratory:  Negative for cough and shortness of breath.   Cardiovascular:  Negative for chest pain and palpitations.  Gastrointestinal:  Negative for abdominal pain and vomiting.  Genitourinary:  Negative for dysuria and hematuria.  Musculoskeletal:  Negative for arthralgias and back pain.  Skin:  Positive for color change and wound. Negative for rash.  Neurological:  Negative for seizures and syncope.  All other systems reviewed and are negative.  Physical Exam Updated Vital Signs BP (!) 140/99   Pulse 75   Temp 98.2 F (36.8 C) (Oral)   Resp 20   SpO2 100%   Physical Exam Vitals and nursing note reviewed.  HENT:     Head: Normocephalic and atraumatic.  Eyes:     General: No scleral icterus. Pulmonary:     Effort: Pulmonary effort is normal. No respiratory  distress.  Musculoskeletal:     Cervical back: Normal range of motion.     Comments: Ankle ROM is normal. Sensation normal  Skin:    General: Skin is warm and dry.     Comments: 3 small pustules at the medial ankle. Minimal erythema. Mild swelling around this area.   Neurological:     General: No focal deficit present.     Mental Status: She is alert and oriented to person, place, and time.  Psychiatric:        Mood and Affect: Mood normal.    ED Results / Procedures / Treatments   Labs (all labs ordered are listed, but only abnormal results are displayed) Labs Reviewed  RESP PANEL BY RT-PCR (FLU A&B, COVID) [id number]    EKG None  Radiology No results  found.  Procedures Procedures   Medications Ordered in ED Medications  bacitracin-neomycin-polymyxin-hydrocortisone (CORTISPORIN) 1 % ointment (has no administration in time range)    ED Course  I have reviewed the triage vital signs and the nursing notes.  Pertinent labs & imaging results that were available during my care of the patient were reviewed by me and considered in my medical decision making (see chart for details).    MDM Rules/Calculators/A&P                           Jasmine Hurley presented with insect bites to her left ankle.  These appear to be infected, and I am concerned about the swelling that is developing around her ankle.  Nonetheless, I do not think she had a septic arthritis.  Range of motion of her ankle is normal and not painful.  I have recommended antibiotics, and we discussed careful return precautions.  COVID test was ordered as she requested.  She will be discharged home. Final Clinical Impression(s) / ED Diagnoses Final diagnoses:  Insect bite of left ankle, initial encounter  Cellulitis of left ankle  Encounter for screening for COVID-19    Rx / DC Orders ED Discharge Orders          Ordered    doxycycline (VIBRAMYCIN) 100 MG capsule  2 times daily        03/13/21 1217             Koleen Distance, MD 03/13/21 1231

## 2021-03-13 NOTE — ED Notes (Signed)
Left foot insect bites cleansed and covered with bacitracin and dry dressing. Dc isntructions and wound care discussed and patient verbalized understanding.

## 2021-06-24 ENCOUNTER — Other Ambulatory Visit: Payer: Self-pay

## 2021-06-24 ENCOUNTER — Other Ambulatory Visit (HOSPITAL_COMMUNITY)
Admission: RE | Admit: 2021-06-24 | Discharge: 2021-06-24 | Disposition: A | Discharge status: Home / Self Care | Payer: BC Managed Care – PPO | Source: Ambulatory Visit | Attending: Certified Nurse Midwife | Admitting: Certified Nurse Midwife

## 2021-06-24 ENCOUNTER — Ambulatory Visit (INDEPENDENT_AMBULATORY_CARE_PROVIDER_SITE_OTHER): Payer: BC Managed Care – PPO | Admitting: Certified Nurse Midwife

## 2021-06-24 ENCOUNTER — Encounter: Payer: Self-pay | Admitting: Certified Nurse Midwife

## 2021-06-24 VITALS — BP 125/71 | HR 87 | Temp 98.1°F | Ht 66.0 in | Wt 190.4 lb

## 2021-06-24 DIAGNOSIS — N898 Other specified noninflammatory disorders of vagina: Secondary | ICD-10-CM

## 2021-06-24 DIAGNOSIS — Z0000 Encounter for general adult medical examination without abnormal findings: Secondary | ICD-10-CM | POA: Diagnosis present

## 2021-06-24 DIAGNOSIS — Z3042 Encounter for surveillance of injectable contraceptive: Secondary | ICD-10-CM

## 2021-06-24 LAB — POCT URINE PREGNANCY: Preg Test, Ur: NEGATIVE

## 2021-06-24 MED ORDER — MEDROXYPROGESTERONE ACETATE 150 MG/ML IM SUSP: 150.0000 mg | INTRAMUSCULAR | 3 refills | Status: AC

## 2021-06-24 MED ORDER — MEDROXYPROGESTERONE ACETATE 150 MG/ML IM SUSP
150.0000 mg | Freq: Once | INTRAMUSCULAR | Status: AC
Start: 2021-06-24 — End: 2021-06-24
  Administered 2021-06-24: 11:00:00 150 mg via INTRAMUSCULAR

## 2021-06-24 NOTE — Progress Notes (Signed)
° ° ° °  Subjective:  Pt in for Depo Provera injection.    Objective: Need for contraception. No unusual complaints.    Assessment: Pt tolerated Depo injection. Depo given Left upper outer quadrant.   Plan:  Next injection due 09/09/21-09/23/21.    Clovis Pu, RN

## 2021-06-25 DIAGNOSIS — Z3042 Encounter for surveillance of injectable contraceptive: Secondary | ICD-10-CM | POA: Insufficient documentation

## 2021-06-25 NOTE — Progress Notes (Signed)
GYNECOLOGY CLINIC ANNUAL PREVENTATIVE CARE ENCOUNTER NOTE  Subjective:   KEIERRA Hurley is a 25 y.o. G0P0000 female here for a routine annual gynecologic exam.  Current complaints: having some discharge and wants to restart Depo.   Denies abnormal vaginal bleeding, discharge, pelvic pain, problems with intercourse or other gynecologic concerns.    Gynecologic History Patient's last menstrual period was 06/12/2021 (within days). Contraception: none, was on Depo, is overdue for her next dose Last Pap: 12/31/20. Results were: normal Last mammogram: N/A.   Obstetric History OB History  Gravida Para Term Preterm AB Living  0 0 0 0 0 0  SAB IAB Ectopic Multiple Live Births  0 0 0 0 0   History reviewed. No pertinent past medical history.  History reviewed. No pertinent surgical history.  No current outpatient medications on file prior to visit.   Current Facility-Administered Medications on File Prior to Visit  Medication Dose Route Frequency Provider Last Rate Last Admin   medroxyPROGESTERone (DEPO-PROVERA) injection 150 mg  150 mg Intramuscular Q90 days Jasmine Hurley, Jasmine Course, FNP   150 mg at 04/23/20 1436   No Known Allergies  Social History   Socioeconomic History   Marital status: Single    Spouse name: Not on file   Number of children: Not on file   Years of education: Not on file   Highest education level: Not on file  Occupational History   Not on file  Tobacco Use   Smoking status: Never    Passive exposure: Never   Smokeless tobacco: Never  Vaping Use   Vaping Use: Never used  Substance and Sexual Activity   Alcohol use: Not Currently   Drug use: No   Sexual activity: Yes    Comment: Depo shot  Other Topics Concern   Not on file  Social History Narrative   Not on file   Social Determinants of Health   Financial Resource Strain: Not on file  Food Insecurity: Not on file  Transportation Needs: Not on file  Physical Activity: Not on file  Stress: Not on  file  Social Connections: Not on file  Intimate Partner Violence: Not on file   Family History  Problem Relation Age of Onset   Hypertension Mother    Hypertension Father    The following portions of the patient's history were reviewed and updated as appropriate: allergies, current medications, past family history, past medical history, past social history, past surgical history and problem list.  Review of Systems Pertinent items noted in HPI and remainder of comprehensive ROS otherwise negative.   Objective:  BP 125/71 (BP Location: Left Arm, Patient Position: Sitting, Cuff Size: Large)    Pulse 87    Temp 98.1 F (36.7 C) (Oral)    Ht 5\' 6"  (1.676 m)    Wt 190 lb 6.4 oz (86.4 kg)    LMP 06/12/2021 (Within Days)    BMI 30.73 kg/m  CONSTITUTIONAL: Well-developed, well-nourished female in no acute distress.  HENT:  Normocephalic, atraumatic EYES: Conjunctivae and EOM are normal. Pupils are equal, round, and reactive to light. No scleral icterus.  NECK: Normal range of motion, supple, no masses.   SKIN: Skin is warm and dry. No rash noted. Not diaphoretic. No erythema. No pallor. NEUROLGIC: Alert and oriented to person, place, and time.  PSYCHIATRIC: Normal mood and affect. Normal behavior. Normal judgment and thought content. CARDIOVASCULAR: Normal heart rate noted RESPIRATORY: Effort normal, no problems with respiration noted. BREASTS: exam deferred, no problems  reported ABDOMEN: Soft, nontender PELVIC: Exam deferred, self-swabs obtained MUSCULOSKELETAL: Normal range of motion. No tenderness.  No cyanosis, clubbing, or edema.  2+ distal pulses.  Assessment & Plan:  Encounter for well woman exam without gynecological exam - Plan: POCT urine pregnancy, Cervicovaginal ancillary only( [place])  Vaginal discharge - Plan: Cervicovaginal ancillary only( [place])  Family planning, Depo-Provera contraception monitoring/administration - Plan: POCT urine pregnancy,  medroxyPROGESTERone (DEPO-PROVERA) injection 150 mg, medroxyPROGESTERone (DEPO-PROVERA) 150 MG/ML injection   Follow up in 77mo for depo shot and PRN or for annual exam.  Jasmine Hurley, CNM

## 2021-06-26 LAB — CERVICOVAGINAL ANCILLARY ONLY
Bacterial Vaginitis (gardnerella): NEGATIVE
Candida Glabrata: NEGATIVE
Candida Vaginitis: NEGATIVE
Chlamydia: NEGATIVE
Comment: NEGATIVE
Comment: NEGATIVE
Comment: NEGATIVE
Comment: NEGATIVE
Comment: NEGATIVE
Comment: NORMAL
Neisseria Gonorrhea: NEGATIVE
Trichomonas: NEGATIVE

## 2021-08-25 ENCOUNTER — Telehealth: Payer: Self-pay | Admitting: Obstetrics and Gynecology

## 2021-08-25 NOTE — Telephone Encounter (Signed)
-----   Message from Vivien Rota sent at 08/19/2021 11:58 AM EST ----- Reschedule depo injection 03/8-03/22

## 2021-08-25 NOTE — Telephone Encounter (Signed)
Attempted to reach patient. Left a message for her to call the office.

## 2021-08-27 ENCOUNTER — Telehealth: Payer: Self-pay | Admitting: Obstetrics and Gynecology

## 2021-08-27 NOTE — Telephone Encounter (Signed)
After several attempts to reach patient to inform her of the changes being made in this office, I left a detailed voicemail message for her to call us to reschedule her depo injection at another location.

## 2021-09-02 ENCOUNTER — Telehealth: Payer: Self-pay | Admitting: Certified Nurse Midwife

## 2021-09-02 NOTE — Telephone Encounter (Signed)
Attempted to reach patient about getting her scheduled for her Depo injection at another location. Left her a voicemail message. Called her mother, and her mother and was not able to reach her. ?

## 2021-09-02 NOTE — Telephone Encounter (Signed)
Spoke with patient about changes in this office, and how I needed to get her scheduled at another location. She requested to go to [facility]. I told her I would call and get her scheduled. She stated she wanted to call and schedule her appointment. I informed her she had until March 22nd to get her injection. She stated she understood.  ?

## 2021-09-15 ENCOUNTER — Ambulatory Visit (INDEPENDENT_AMBULATORY_CARE_PROVIDER_SITE_OTHER): Payer: Managed Care, Other (non HMO)

## 2021-09-15 ENCOUNTER — Other Ambulatory Visit: Payer: Self-pay

## 2021-09-15 ENCOUNTER — Ambulatory Visit: Payer: BC Managed Care – PPO

## 2021-09-15 VITALS — BP 133/85 | HR 88 | Wt 193.5 lb

## 2021-09-15 DIAGNOSIS — F419 Anxiety disorder, unspecified: Secondary | ICD-10-CM

## 2021-09-15 DIAGNOSIS — Z3042 Encounter for surveillance of injectable contraceptive: Secondary | ICD-10-CM | POA: Diagnosis not present

## 2021-09-15 MED ORDER — MEDROXYPROGESTERONE ACETATE 150 MG/ML IM SUSP
150.0000 mg | Freq: Once | INTRAMUSCULAR | Status: AC
  Administered 2021-09-15: 150 mg via INTRAMUSCULAR

## 2021-09-15 NOTE — BH Specialist Note (Deleted)
Integrated Behavioral Health Initial In-Person Visit ? ?MRN: [id number] ?Name: Jasmine Hurley ? ?Number of Integrated Behavioral Health Clinician visits: No data recorded ?Session Start time: No data recorded   ?Session End time: No data recorded ?Total time in minutes: No data recorded ? ?Types of Service: {CHL AMB TYPE OF [phone]} ? ?Interpretor:{yes QB:[id number]} Interpretor Name and Language: *** ? ? Warm Hand Off Completed. ?  ? ?  ? ? ?Subjective: ?Jasmine Hurley is a 26 y.o. female accompanied by {CHL AMB ACCOMPANIED BY:[phone]} ?Patient was referred by *** for ***. ?Patient reports the following symptoms/concerns: *** ?Duration of problem: ***; Severity of problem: {Mild/Moderate/Severe:20260} ? ?Objective: ?Mood: {BHH MOOD:22306} and Affect: {BHH AFFECT:22307} ?Risk of harm to self or others: {CHL AMB BH Suicide Current Mental Status:21022748} ? ?Life Context: ?Family and Social: *** ?School/Work: *** ?Self-Care: *** ?Life Changes: *** ? ?Patient and/or Family's Strengths/Protective Factors: ?{CHL AMB BH PROTECTIVE FACTORS:4791891030} ? ?Goals Addressed: ?Patient will: ?Reduce symptoms of: {IBH Symptoms:21014056} ?Increase knowledge and/or ability of: {IBH Patient Tools:21014057}  ?Demonstrate ability to: {IBH Goals:21014053} ? ?Progress towards Goals: ?{CHL AMB BH PROGRESS TOWARDS TKWIO:[id number]} ? ?Interventions: ?Interventions utilized: {IBH Interventions:21014054}  ?Standardized Assessments completed: {IBH Screening Tools:21014051} ? ?Patient and/or Family Response: *** ? ?Patient Centered Plan: ?Patient is on the following Treatment Plan(s):  *** ? ?Assessment: ?Patient currently experiencing ***. ?  ?Patient may benefit from ***. ? ?Plan: ?Follow up with behavioral health clinician on : *** ?Behavioral recommendations: *** ?Referral(s): {IBH Referrals:21014055} ?"From scale of 1-10, how likely are you to follow plan?": *** ? ?Rae Lips, LCSW ? ?Depression screen [facility] 2/9 09/15/2021  06/12/2020 04/21/2020 01/10/2020 09/14/2019  ?Decreased Interest 0 0 0 0 0  ?Down, Depressed, Hopeless 0 0 0 0 0  ?PHQ - 2 Score 0 0 0 0 0  ?Altered sleeping 3 - - - -  ?Tired, decreased energy 3 - - - -  ?Change in appetite 3 - - - -  ?Feeling bad or failure about yourself  0 - - - -  ?Trouble concentrating 0 - - - -  ?Moving slowly or fidgety/restless 2 - - - -  ?Suicidal thoughts 0 - - - -  ?PHQ-9 Score 11 - - - -  ? ?GAD 7 : Generalized Anxiety Score 09/15/2021 01/10/2020 10/02/2019 09/14/2019  ?Nervous, Anxious, on Edge 2 0 0 0  ?Control/stop worrying 3 0 0 0  ?Worry too much - different things 3 0 0 0  ?Trouble relaxing 3 0 0 0  ?Restless 3 0 0 0  ?Easily annoyed or irritable 2 0 0 0  ?Afraid - awful might happen 2 0 0 0  ?Total GAD 7 Score 18 0 0 0  ?Anxiety Difficulty - Not difficult at all Not difficult at all Not difficult at all  ? ? ? ? ? ? ? ? ? ?

## 2021-09-15 NOTE — Progress Notes (Signed)
Jasmine Hurley here for Depo-Provera Injection. Injection administered without complication.  ? ?Patient reports having brown vaginal discharge for past year, approx 4 days/week. States that every test for infeciton has been negative. Per chart review, negative vaginal swab 06/24/21. States discharge has odor like blood, but this does not appear like spotting or a period. States the only time she doesn't have this discharge is during her period when she is not on Depo Provera.  ? ?Positive PHQ-9 and GAD-7 today; denies SI or thoughts of harm. Interested in [facility] services. Patient to schedule appt with Roselyn Reef during check out. Patient states she cannot do virtual appointment, has trouble staying awake during long virtual appts.  ? ?Patient to schedule appt with Samella Parr, CNM to discuss discharge. Patient will return in 3 months for next injection between 12/01/21 and 12/15/21. Next annual visit due December 2023.  ? ?Annabell Howells, RN ?09/15/2021  3:17 PM ? ?

## 2021-09-17 ENCOUNTER — Ambulatory Visit (INDEPENDENT_AMBULATORY_CARE_PROVIDER_SITE_OTHER): Payer: Managed Care, Other (non HMO) | Admitting: Certified Nurse Midwife

## 2021-09-17 ENCOUNTER — Other Ambulatory Visit (HOSPITAL_COMMUNITY)
Admission: RE | Admit: 2021-09-17 | Discharge: 2021-09-17 | Disposition: A | Discharge status: Home / Self Care | Payer: Managed Care, Other (non HMO) | Source: Ambulatory Visit | Attending: Certified Nurse Midwife | Admitting: Certified Nurse Midwife

## 2021-09-17 ENCOUNTER — Other Ambulatory Visit: Payer: Self-pay

## 2021-09-17 ENCOUNTER — Encounter: Payer: Self-pay | Admitting: Certified Nurse Midwife

## 2021-09-17 VITALS — BP 133/88 | HR 72 | Ht 66.0 in | Wt 194.0 lb

## 2021-09-17 DIAGNOSIS — N939 Abnormal uterine and vaginal bleeding, unspecified: Secondary | ICD-10-CM | POA: Diagnosis not present

## 2021-09-17 DIAGNOSIS — N898 Other specified noninflammatory disorders of vagina: Secondary | ICD-10-CM | POA: Insufficient documentation

## 2021-09-17 DIAGNOSIS — N750 Cyst of Bartholin's gland: Secondary | ICD-10-CM

## 2021-09-17 DIAGNOSIS — Z124 Encounter for screening for malignant neoplasm of cervix: Secondary | ICD-10-CM

## 2021-09-17 DIAGNOSIS — Z01419 Encounter for gynecological examination (general) (routine) without abnormal findings: Secondary | ICD-10-CM

## 2021-09-17 NOTE — Progress Notes (Signed)
? ?ANNUAL EXAM ?Patient name: Jasmine Hurley MRN [id number]  Date of birth: 07/03/1996 ?Chief Complaint:   ?Gynecologic Exam ? ?History of Present Illness:   ?Jasmine Hurley is a 26 y.o. G0P0000 African-American female being seen today for a routine annual exam.  ?Current complaints: a painful (sore) area just inside her vagina, increased malodorous vaginal discharge and abnormal uterine bleeding ? ?No LMP recorded. Patient has had an injection. ? ?Last pap None.  ?Last mammogram: N/A. Family h/o breast cancer: no ?Last colonoscopy: N/A. Family h/o colorectal cancer: no ? ?Depression screen [facility] 2/9 09/15/2021 06/12/2020 04/21/2020 01/10/2020 09/14/2019  ?Decreased Interest 0 0 0 0 0  ?Down, Depressed, Hopeless 0 0 0 0 0  ?PHQ - 2 Score 0 0 0 0 0  ?Altered sleeping 3 - - - -  ?Tired, decreased energy 3 - - - -  ?Change in appetite 3 - - - -  ?Feeling bad or failure about yourself  0 - - - -  ?Trouble concentrating 0 - - - -  ?Moving slowly or fidgety/restless 2 - - - -  ?Suicidal thoughts 0 - - - -  ?PHQ-9 Score 11 - - - -  ? ?  ?GAD 7 : Generalized Anxiety Score 09/15/2021 01/10/2020 10/02/2019 09/14/2019  ?Nervous, Anxious, on Edge 2 0 0 0  ?Control/stop worrying 3 0 0 0  ?Worry too much - different things 3 0 0 0  ?Trouble relaxing 3 0 0 0  ?Restless 3 0 0 0  ?Easily annoyed or irritable 2 0 0 0  ?Afraid - awful might happen 2 0 0 0  ?Total GAD 7 Score 18 0 0 0  ?Anxiety Difficulty - Not difficult at all Not difficult at all Not difficult at all  ? ?Review of Systems:   ?Pertinent items are noted in HPI ?Denies any headaches, blurred vision, fatigue, shortness of breath, chest pain, abdominal pain, abnormal vaginal discharge/itching/odor/irritation, problems with periods, bowel movements, urination, or intercourse unless otherwise stated above. ?Pertinent History Reviewed:  ?Reviewed past medical,surgical, social and family history.  ?Reviewed problem list, medications and allergies. ?Physical Assessment:  ? ?Vitals:  ?  09/17/21 0957  ?BP: 133/88  ?Pulse: 72  ?Weight: 194 lb (88 kg)  ?Height: 5\' 6"  (1.676 m)  ? Body mass index is 31.31 kg/m?. ?  ?     Physical Examination:  ? General appearance - well appearing, and in no distress ? Mental status - alert, oriented to person, place, and time ? Psych:  She has a normal mood and affect ? Skin - warm and dry, normal color, no suspicious lesions noted ? Chest - effort normal ? Heart - normal rate and regular rhythm ? Neck:  midline trachea, no thyromegaly or nodules ? Breasts - breasts appear normal ? Abdomen - soft, nontender, nondistended, no masses or organomegaly ? Pelvic - VULVA: normal appearing vulva with no masses, tenderness or lesions  VAGINA: normal appearing vagina with normal color and discharge, no lesions, tenderness and a large marble sized lump just inside the introitus on the pt's left side consistent and with a Bartholin cyst. No drainage and not overly tender. CERVIX: normal appearing cervix without discharge or lesions, no CMT ? Thin prep pap is done with HR HPV cotesting ? UTERUS: uterus is felt to be normal size, shape, consistency and nontender  ? ADNEXA: No adnexal masses or tenderness noted. ? Extremities:  No swelling or varicosities noted ? ?Chaperone present for exam ? ?No results  found for this or any previous visit (from the past 24 hour(s)).  ?Assessment & Plan:  ?1. Women's annual routine gynecological examination ?- Cytology - PAP( [place]) ? ?2. Vaginal discharge ?- Cervicovaginal ancillary only( [place]) ? ?3. Abnormal uterine bleeding ?- US PELVIS (TRANSABDOMINAL ONLY); Future ? ?4. Bartholin cyst ?- Bactrim prescribed and advised to use epsom salt baths and moist heat to the area ? ?5. Cervical cancer screening ?- Cytology - PAP( [place]) ? ?Will follow up results of pap smear and manage accordingly. ?Routine preventative health maintenance measures emphasized. ?Please refer to After Visit Summary for other counseling recommendations.   ? ?Mammogram: @ 26yo, or sooner if problems ?Colonoscopy: @ 26yo, or sooner if problems ? ?Orders Placed This Encounter  ?Procedures  ? US PELVIS (TRANSABDOMINAL ONLY)  ? ?Meds:  ?Meds ordered this encounter  ?Medications  ? sulfamethoxazole-trimethoprim (BACTRIM DS) 800-160 MG tablet  ?  Sig: Take 1 tablet by mouth 2 (two) times daily for 10 days.  ?  Dispense:  20 tablet  ?  Refill:  0  ?  Order Specific Question:   Supervising Provider  ?  Answer:   Reva Bores [[id number]]  ? ?Follow-up: Return in about 1 month (around 10/18/2021) for follow up. ? ?Bernerd Limbo, CNM ?09/22/2021 ?3:20 AM ? ?

## 2021-09-17 NOTE — Progress Notes (Signed)
Pt states that she discovered a lump on the inside of her Vagina. ?

## 2021-09-18 LAB — CYTOLOGY - PAP
Adequacy: ABSENT
Diagnosis: NEGATIVE

## 2021-09-18 LAB — CERVICOVAGINAL ANCILLARY ONLY
Bacterial Vaginitis (gardnerella): NEGATIVE
Candida Glabrata: NEGATIVE
Candida Vaginitis: NEGATIVE
Chlamydia: NEGATIVE
Comment: NEGATIVE
Comment: NEGATIVE
Comment: NEGATIVE
Comment: NEGATIVE
Comment: NEGATIVE
Comment: NORMAL
Neisseria Gonorrhea: NEGATIVE
Trichomonas: NEGATIVE

## 2021-09-22 MED ORDER — SULFAMETHOXAZOLE-TRIMETHOPRIM 800-160 MG PO TABS: 1.0000 | ORAL_TABLET | Freq: Two times a day (BID) | ORAL | 0 refills | Status: AC

## 2021-09-23 ENCOUNTER — Institutional Professional Consult (permissible substitution): Payer: BC Managed Care – PPO

## 2021-09-24 ENCOUNTER — Ambulatory Visit (HOSPITAL_COMMUNITY): Payer: BC Managed Care – PPO

## 2021-10-02 ENCOUNTER — Ambulatory Visit (HOSPITAL_COMMUNITY): Admission: RE | Admit: 2021-10-02 | Payer: BC Managed Care – PPO | Source: Ambulatory Visit

## 2021-10-22 ENCOUNTER — Ambulatory Visit (HOSPITAL_COMMUNITY): Admission: RE | Admit: 2021-10-22 | Payer: BC Managed Care – PPO | Source: Ambulatory Visit

## 2021-10-23 ENCOUNTER — Telehealth: Payer: Self-pay

## 2021-10-23 ENCOUNTER — Ambulatory Visit: Payer: BC Managed Care – PPO | Admitting: Certified Nurse Midwife

## 2021-10-23 NOTE — Telephone Encounter (Signed)
Patient does not need appt this AM per Dan Humphreys, CNM. Called pt; left VM. MyChart message sent. Patient should be scheduled for ultrasound and follow up with MD. ?

## 2021-12-02 ENCOUNTER — Ambulatory Visit: Payer: BC Managed Care – PPO

## 2021-12-03 ENCOUNTER — Telehealth: Payer: Self-pay | Admitting: Clinical

## 2021-12-03 NOTE — Telephone Encounter (Signed)
Attempt to call pt regarding referral; Left HIPPA-compliant message to call back Kaiel Weide from [facility] at  [phone] (Braya Habermehl's office).   

## 2022-06-08 DIAGNOSIS — R7303 Prediabetes: Secondary | ICD-10-CM | POA: Diagnosis not present

## 2022-06-08 DIAGNOSIS — E669 Obesity, unspecified: Secondary | ICD-10-CM | POA: Diagnosis not present

## 2022-07-16 DIAGNOSIS — G4700 Insomnia, unspecified: Secondary | ICD-10-CM | POA: Diagnosis not present

## 2022-07-16 DIAGNOSIS — N898 Other specified noninflammatory disorders of vagina: Secondary | ICD-10-CM | POA: Diagnosis not present

## 2022-07-16 DIAGNOSIS — E669 Obesity, unspecified: Secondary | ICD-10-CM | POA: Diagnosis not present

## 2022-08-10 ENCOUNTER — Emergency Department (HOSPITAL_BASED_OUTPATIENT_CLINIC_OR_DEPARTMENT_OTHER)
Admission: EM | Admit: 2022-08-10 | Discharge: 2022-08-11 | Disposition: A | Discharge status: Home / Self Care | Payer: Managed Care, Other (non HMO) | Attending: Emergency Medicine | Admitting: Emergency Medicine

## 2022-08-10 ENCOUNTER — Encounter (HOSPITAL_BASED_OUTPATIENT_CLINIC_OR_DEPARTMENT_OTHER): Payer: Self-pay

## 2022-08-10 ENCOUNTER — Emergency Department (HOSPITAL_BASED_OUTPATIENT_CLINIC_OR_DEPARTMENT_OTHER): Payer: Managed Care, Other (non HMO)

## 2022-08-10 ENCOUNTER — Other Ambulatory Visit: Payer: Self-pay

## 2022-08-10 DIAGNOSIS — R202 Paresthesia of skin: Secondary | ICD-10-CM | POA: Diagnosis not present

## 2022-08-10 DIAGNOSIS — M791 Myalgia, unspecified site: Secondary | ICD-10-CM | POA: Diagnosis not present

## 2022-08-10 DIAGNOSIS — R072 Precordial pain: Secondary | ICD-10-CM | POA: Insufficient documentation

## 2022-08-10 DIAGNOSIS — Z1152 Encounter for screening for COVID-19: Secondary | ICD-10-CM | POA: Insufficient documentation

## 2022-08-10 DIAGNOSIS — R0602 Shortness of breath: Secondary | ICD-10-CM

## 2022-08-10 DIAGNOSIS — M79604 Pain in right leg: Secondary | ICD-10-CM | POA: Insufficient documentation

## 2022-08-10 DIAGNOSIS — R000 Tachycardia, unspecified: Secondary | ICD-10-CM | POA: Insufficient documentation

## 2022-08-10 DIAGNOSIS — F419 Anxiety disorder, unspecified: Secondary | ICD-10-CM

## 2022-08-10 DIAGNOSIS — E876 Hypokalemia: Secondary | ICD-10-CM | POA: Diagnosis not present

## 2022-08-10 DIAGNOSIS — R0789 Other chest pain: Secondary | ICD-10-CM

## 2022-08-10 DIAGNOSIS — M79605 Pain in left leg: Secondary | ICD-10-CM | POA: Diagnosis not present

## 2022-08-10 LAB — BASIC METABOLIC PANEL
Anion gap: 9 (ref 5–15)
BUN: 6 mg/dL (ref 6–20)
CO2: 25 mmol/L (ref 22–32)
Calcium: 9.8 mg/dL (ref 8.9–10.3)
Chloride: 104 mmol/L (ref 98–111)
Creatinine, Ser: 0.89 mg/dL (ref 0.44–1.00)
GFR, Estimated: >60 mL/min (ref >60)
Glucose, Bld: 153 mg/dL — ABNORMAL HIGH (ref 70–99)
Potassium: 3.3 mmol/L — ABNORMAL LOW (ref 3.5–5.1)
Sodium: 138 mmol/L (ref 135–145)

## 2022-08-10 LAB — URINALYSIS, ROUTINE W REFLEX MICROSCOPIC
Bilirubin Urine: NEGATIVE
Glucose, UA: NEGATIVE mg/dL
Leukocytes,Ua: NEGATIVE
Nitrite: NEGATIVE
Protein, ur: 30 mg/dL — AB
Specific Gravity, Urine: 1.038 — ABNORMAL HIGH (ref 1.005–1.030)
pH: 6 (ref 5.0–8.0)

## 2022-08-10 LAB — CK: Total CK: 149 U/L (ref 38–234)

## 2022-08-10 LAB — CBC
HCT: 38.3 % (ref 36.0–46.0)
Hemoglobin: 12.8 g/dL (ref 12.0–15.0)
MCH: 30 pg (ref 26.0–34.0)
MCHC: 33.4 g/dL (ref 30.0–36.0)
MCV: 89.9 fL (ref 80.0–100.0)
Platelets: 334 10*3/uL (ref 150–400)
RBC: 4.26 MIL/uL (ref 3.87–5.11)
RDW: 12.8 % (ref 11.5–15.5)
WBC: 6.1 10*3/uL (ref 4.0–10.5)
nRBC: 0 % (ref 0.0–0.2)

## 2022-08-10 LAB — D-DIMER, QUANTITATIVE: D-Dimer, Quant: <0.27 ug{FEU}/mL (ref 0.00–0.50)

## 2022-08-10 LAB — TROPONIN I (HIGH SENSITIVITY): Troponin I (High Sensitivity): <2 ng/L

## 2022-08-10 LAB — RESP PANEL BY RT-PCR (RSV, FLU A&B, COVID)  RVPGX2
Influenza A by PCR: NEGATIVE
Influenza B by PCR: NEGATIVE
Resp Syncytial Virus by PCR: NEGATIVE
SARS Coronavirus 2 by RT PCR: NEGATIVE

## 2022-08-10 LAB — MAGNESIUM: Magnesium: 2.1 mg/dL (ref 1.7–2.4)

## 2022-08-10 LAB — CBG MONITORING, ED: Glucose-Capillary: 172 mg/dL — ABNORMAL HIGH (ref 70–99)

## 2022-08-10 LAB — PREGNANCY, URINE: Preg Test, Ur: NEGATIVE

## 2022-08-10 MED ORDER — LACTATED RINGERS IV BOLUS
1000.0000 mL | Freq: Once | INTRAVENOUS | Status: AC
  Administered 2022-08-10: 1000 mL via INTRAVENOUS

## 2022-08-10 MED ORDER — POTASSIUM CHLORIDE CRYS ER 20 MEQ PO TBCR
20.0000 meq | EXTENDED_RELEASE_TABLET | Freq: Once | ORAL | Status: AC
  Administered 2022-08-10: 20 meq via ORAL
  Filled 2022-08-10: qty 1

## 2022-08-10 MED ORDER — LORAZEPAM 1 MG PO TABS
1.0000 mg | ORAL_TABLET | Freq: Once | ORAL | Status: AC
  Administered 2022-08-10: 1 mg via ORAL
  Filled 2022-08-10: qty 1

## 2022-08-10 NOTE — ED Notes (Signed)
Pt reports that she began having sob a week ago, but only when lying flat.

## 2022-08-10 NOTE — ED Notes (Signed)
Pt from home with bilateral leg pain, heaviness, numbness x 10 days, worsening on Saturday. Pt reports sob upon exertion that began today. Pt alert & oriented, nad noted.

## 2022-08-10 NOTE — ED Notes (Signed)
Pt reported she felt like the LR was burning going into her arm. We turned it down to 555/hour and pt reports no more burning.

## 2022-08-10 NOTE — ED Triage Notes (Signed)
Pt arrives POV from home with c/o shortness of breath and intermittent chest pressure that started earlier today.  Reports bilateral leg pain, right greater than left, that began on Saturday.  Denies fevers, N/V/D.

## 2022-08-10 NOTE — Discharge Instructions (Addendum)
Your potassium was mildly low and was replenished orally.  Your neurologic exam is reassuring today.  Your cardiac enzyme was normal and a blood test that checks for blood clots and rules them out was also negative.  Your heart rate improved with IV fluids and oral Ativan.

## 2022-08-10 NOTE — ED Provider Notes (Signed)
[facility] Provider Note   CSN: [id number] Arrival date & time: 08/10/22  2012     History {Add pertinent medical, surgical, social history, OB history to HPI:1} Chief Complaint  Patient presents with   Shortness of Breath    Jasmine Hurley is a 27 y.o. female.   Shortness of Breath      Home Medications Prior to Admission medications   Medication Sig Start Date End Date Taking? Authorizing Provider  hydrOXYzine (ATARAX) 10 MG tablet Take 10 mg by mouth at bedtime.   Yes [provider]  phentermine (ADIPEX-P) 37.5 MG tablet Take 37.5 mg by mouth daily before breakfast.   Yes [provider]  sertraline (ZOLOFT) 50 MG tablet Take 50 mg by mouth daily.   Yes [provider]  medroxyPROGESTERone (DEPO-PROVERA) 150 MG/ML injection Inject 1 mL (150 mg total) into the muscle every 3 (three) months. 06/24/21   Gabriel Carina, CNM      Allergies    Patient has no known allergies.    Review of Systems   Review of Systems  Respiratory:  Positive for shortness of breath.     Physical Exam Updated Vital Signs BP (!) 138/97 (BP Location: Right Arm)   Pulse (!) 110   Temp 97.8 F (36.6 C)   Resp 18   Ht 5\' 6"  (1.676 m)   Wt 86.2 kg   LMP 07/14/2022 (Approximate)   SpO2 100%   BMI 30.67 kg/m  Physical Exam  ED Results / Procedures / Treatments   Labs (all labs ordered are listed, but only abnormal results are displayed) Labs Reviewed  BASIC METABOLIC PANEL - Abnormal; Notable for the following components:      Result Value   Potassium 3.3 (*)    Glucose, Bld 153 (*)    All other components within normal limits  URINALYSIS, ROUTINE W REFLEX MICROSCOPIC - Abnormal; Notable for the following components:   Specific Gravity, Urine 1.038 (*)    Hgb urine dipstick SMALL (*)    Ketones, ur TRACE (*)    Protein, ur 30 (*)    Bacteria, UA RARE (*)    All other components within normal limits  CBG  MONITORING, ED - Abnormal; Notable for the following components:   Glucose-Capillary 172 (*)    All other components within normal limits  RESP PANEL BY RT-PCR (RSV, FLU A&B, COVID)  RVPGX2  CBC  D-DIMER, QUANTITATIVE  PREGNANCY, URINE  TROPONIN I (HIGH SENSITIVITY)  TROPONIN I (HIGH SENSITIVITY)    EKG None  Radiology DG Chest Portable 1 View  Result Date: 08/10/2022 CLINICAL DATA:  Shortness of breath EXAM: PORTABLE CHEST 1 VIEW COMPARISON:  None Available. FINDINGS: The heart size and mediastinal contours are within normal limits. Both lungs are clear. The visualized skeletal structures are unremarkable. IMPRESSION: No active disease. Electronically Signed   By: Donavan Foil M.D.   On: 08/10/2022 21:53    Procedures Procedures  {Document cardiac monitor, telemetry assessment procedure when appropriate:1}  Medications Ordered in ED Medications - No data to display  ED Course/ Medical Decision Making/ A&P   {   Click here for ABCD2, HEART and other calculatorsREFRESH Note before signing :1}                          Medical Decision Making Amount and/or Complexity of Data Reviewed Labs: ordered. Radiology: ordered.   ***  {Document critical care  time when appropriate:1} {Document review of labs and clinical decision tools ie heart score, Chads2Vasc2 etc:1}  {Document your independent review of radiology images, and any outside records:1} {Document your discussion with family members, caretakers, and with consultants:1} {Document social determinants of health affecting pt's care:1} {Document your decision making why or why not admission, treatments were needed:1} Final Clinical Impression(s) / ED Diagnoses Final diagnoses:  None    Rx / DC Orders ED Discharge Orders     None

## 2022-08-11 DIAGNOSIS — R0602 Shortness of breath: Secondary | ICD-10-CM | POA: Diagnosis not present

## 2022-08-11 LAB — TSH: TSH: 2.252 u[IU]/mL (ref 0.350–4.500)

## 2022-08-11 LAB — TROPONIN I (HIGH SENSITIVITY): Troponin I (High Sensitivity): <2 ng/L (ref <18)

## 2022-08-11 LAB — T4, FREE: Free T4: 0.9 ng/dL (ref 0.61–1.12)

## 2022-08-20 DIAGNOSIS — Z7689 Persons encountering health services in other specified circumstances: Secondary | ICD-10-CM | POA: Diagnosis not present

## 2022-08-20 DIAGNOSIS — E669 Obesity, unspecified: Secondary | ICD-10-CM | POA: Diagnosis not present

## 2022-09-22 DIAGNOSIS — R7303 Prediabetes: Secondary | ICD-10-CM | POA: Diagnosis not present

## 2022-09-22 DIAGNOSIS — G4700 Insomnia, unspecified: Secondary | ICD-10-CM | POA: Diagnosis not present

## 2022-09-22 DIAGNOSIS — H9190 Unspecified hearing loss, unspecified ear: Secondary | ICD-10-CM | POA: Diagnosis not present

## 2022-09-22 DIAGNOSIS — F3281 Premenstrual dysphoric disorder: Secondary | ICD-10-CM | POA: Diagnosis not present

## 2022-09-22 DIAGNOSIS — E876 Hypokalemia: Secondary | ICD-10-CM | POA: Diagnosis not present

## 2022-09-22 DIAGNOSIS — E669 Obesity, unspecified: Secondary | ICD-10-CM | POA: Diagnosis not present

## 2022-09-22 DIAGNOSIS — R002 Palpitations: Secondary | ICD-10-CM | POA: Diagnosis not present

## 2022-11-03 DIAGNOSIS — Z720 Tobacco use: Secondary | ICD-10-CM | POA: Diagnosis not present

## 2022-11-03 DIAGNOSIS — Z1283 Encounter for screening for malignant neoplasm of skin: Secondary | ICD-10-CM | POA: Diagnosis not present

## 2022-11-03 DIAGNOSIS — L989 Disorder of the skin and subcutaneous tissue, unspecified: Secondary | ICD-10-CM | POA: Diagnosis not present

## 2022-11-03 DIAGNOSIS — R5383 Other fatigue: Secondary | ICD-10-CM | POA: Diagnosis not present

## 2022-11-03 DIAGNOSIS — R000 Tachycardia, unspecified: Secondary | ICD-10-CM | POA: Diagnosis not present

## 2022-11-03 DIAGNOSIS — Z113 Encounter for screening for infections with a predominantly sexual mode of transmission: Secondary | ICD-10-CM | POA: Diagnosis not present

## 2023-03-02 DIAGNOSIS — R7303 Prediabetes: Secondary | ICD-10-CM | POA: Diagnosis not present

## 2023-03-02 DIAGNOSIS — E669 Obesity, unspecified: Secondary | ICD-10-CM | POA: Diagnosis not present

## 2024-07-12 ENCOUNTER — Other Ambulatory Visit: Payer: Self-pay | Admitting: Obstetrics and Gynecology

## 2024-07-12 DIAGNOSIS — R1902 Left upper quadrant abdominal swelling, mass and lump: Secondary | ICD-10-CM

## 2024-07-31 ENCOUNTER — Other Ambulatory Visit

## 2024-08-16 ENCOUNTER — Other Ambulatory Visit
